# Patient Record
Sex: Male | Born: 1965
Health system: Southern US, Community
[De-identification: ages and names within clinical notes are randomized; demographics above are authoritative.]

## PROBLEM LIST (undated history)

## (undated) DIAGNOSIS — E039 Hypothyroidism, unspecified: Secondary | ICD-10-CM

## (undated) DIAGNOSIS — E785 Hyperlipidemia, unspecified: Secondary | ICD-10-CM

## (undated) DIAGNOSIS — M1990 Unspecified osteoarthritis, unspecified site: Secondary | ICD-10-CM

## (undated) DIAGNOSIS — K219 Gastro-esophageal reflux disease without esophagitis: Secondary | ICD-10-CM

## (undated) DIAGNOSIS — E0500 Thyrotoxicosis with diffuse goiter without thyrotoxic crisis or storm: Secondary | ICD-10-CM

## (undated) DIAGNOSIS — J302 Other seasonal allergic rhinitis: Secondary | ICD-10-CM

## (undated) DIAGNOSIS — I10 Essential (primary) hypertension: Secondary | ICD-10-CM

## (undated) DIAGNOSIS — J189 Pneumonia, unspecified organism: Secondary | ICD-10-CM

## (undated) HISTORY — PX: THYROIDECTOMY: SHX17

## (undated) HISTORY — DX: Other seasonal allergic rhinitis: J30.2

## (undated) HISTORY — DX: Hyperlipidemia, unspecified: E78.5

## (undated) HISTORY — DX: Pneumonia, unspecified organism: J18.9

## (undated) HISTORY — PX: OTHER SURGICAL HISTORY: SHX169

## (undated) HISTORY — DX: Gastro-esophageal reflux disease without esophagitis: K21.9

## (undated) HISTORY — DX: Essential (primary) hypertension: I10

## (undated) HISTORY — DX: Hypothyroidism, unspecified: E03.9

## (undated) HISTORY — PX: WISDOM TOOTH EXTRACTION: SHX21

---

## 2004-08-31 ENCOUNTER — Emergency Department (HOSPITAL_COMMUNITY): Admission: EM | Admit: 2004-08-31 | Discharge: 2004-08-31 | Payer: Self-pay | Admitting: Emergency Medicine

## 2005-04-26 ENCOUNTER — Emergency Department (HOSPITAL_COMMUNITY): Admission: EM | Admit: 2005-04-26 | Discharge: 2005-04-26 | Payer: Self-pay | Admitting: Emergency Medicine

## 2006-07-19 ENCOUNTER — Ambulatory Visit: Payer: Self-pay | Admitting: Family Medicine

## 2006-07-19 LAB — CONVERTED CEMR LAB
ALT: 20 units/L (ref 0–40)
AST: 19 units/L (ref 0–37)
BUN: 15 mg/dL (ref 6–23)
Basophils Relative: 0.5 % (ref 0.0–1.0)
Calcium: 9 mg/dL (ref 8.4–10.5)
Chloride: 106 meq/L (ref 96–112)
Cholesterol: 161 mg/dL (ref 0–200)
Eosinophil percent: 1.9 % (ref 0.0–5.0)
GFR calc non Af Amer: 78 mL/min
HCT: 42.1 % (ref 39.0–52.0)
HDL: 41.4 mg/dL (ref >39.0)
Hemoglobin: 13.6 g/dL (ref 13.0–17.0)
MCV: 87.2 fL (ref 78.0–100.0)
Monocytes Absolute: 0.8 10*3/uL — ABNORMAL HIGH (ref 0.2–0.7)
PSA: 0.96 ng/mL (ref 0.10–4.00)
RBC: 4.83 M/uL (ref 4.22–5.81)
RDW: 14 % (ref 11.5–14.6)
Sodium: 138 meq/L (ref 135–145)
VLDL: 9 mg/dL (ref 0–40)
WBC: 7.5 10*3/uL (ref 4.5–10.5)

## 2006-07-26 ENCOUNTER — Ambulatory Visit: Payer: Self-pay | Admitting: Family Medicine

## 2007-05-20 DIAGNOSIS — K219 Gastro-esophageal reflux disease without esophagitis: Secondary | ICD-10-CM | POA: Insufficient documentation

## 2007-09-03 ENCOUNTER — Telehealth: Payer: Self-pay | Admitting: Family Medicine

## 2007-11-07 ENCOUNTER — Ambulatory Visit: Payer: Self-pay | Admitting: Family Medicine

## 2007-11-07 LAB — CONVERTED CEMR LAB
Bilirubin Urine: NEGATIVE
Ketones, urine, test strip: NEGATIVE
pH: 5.5

## 2007-11-12 LAB — CONVERTED CEMR LAB
Albumin: 3.9 g/dL (ref 3.5–5.2)
Alkaline Phosphatase: 69 units/L (ref 39–117)
BUN: 15 mg/dL (ref 6–23)
Basophils Absolute: 0 10*3/uL (ref 0.0–0.1)
CO2: 29 meq/L (ref 19–32)
Cholesterol: 175 mg/dL (ref 0–200)
GFR calc Af Amer: 94 mL/min
HDL: 42.7 mg/dL (ref >39.0)
Hemoglobin: 14.3 g/dL (ref 13.0–17.0)
Lymphocytes Relative: 32.4 % (ref 12.0–46.0)
MCHC: 32.6 g/dL (ref 30.0–36.0)
Monocytes Absolute: 0.6 10*3/uL (ref 0.2–0.7)
Monocytes Relative: 13.2 % — ABNORMAL HIGH (ref 3.0–11.0)
Neutro Abs: 2.4 10*3/uL (ref 1.4–7.7)
PSA: 0.76 ng/mL (ref 0.10–4.00)
Potassium: 4.8 meq/L (ref 3.5–5.1)
TSH: 0.66 microintl units/mL (ref 0.35–5.50)
Total Protein: 6.9 g/dL (ref 6.0–8.3)

## 2007-11-13 ENCOUNTER — Ambulatory Visit: Payer: Self-pay | Admitting: Family Medicine

## 2007-11-13 DIAGNOSIS — E785 Hyperlipidemia, unspecified: Secondary | ICD-10-CM

## 2010-04-22 ENCOUNTER — Ambulatory Visit: Payer: Self-pay | Admitting: Family Medicine

## 2010-04-28 LAB — CONVERTED CEMR LAB
Albumin: 3.8 g/dL (ref 3.5–5.2)
BUN: 18 mg/dL (ref 6–23)
Basophils Absolute: 0 10*3/uL (ref 0.0–0.1)
CO2: 26 meq/L (ref 19–32)
Cholesterol: 184 mg/dL (ref 0–200)
Eosinophils Absolute: 0.1 10*3/uL (ref 0.0–0.7)
GFR calc non Af Amer: 92.44 mL/min (ref >60)
Glucose, Bld: 77 mg/dL (ref 70–99)
HCT: 41.9 % (ref 39.0–52.0)
Hemoglobin, Urine: NEGATIVE
Leukocytes, UA: NEGATIVE
Lymphs Abs: 1.4 10*3/uL (ref 0.7–4.0)
MCHC: 34 g/dL (ref 30.0–36.0)
MCV: 87.2 fL (ref 78.0–100.0)
Monocytes Absolute: 0.5 10*3/uL (ref 0.1–1.0)
Neutro Abs: 2.8 10*3/uL (ref 1.4–7.7)
PSA: 0.94 ng/mL (ref 0.10–4.00)
Platelets: 189 10*3/uL (ref 150.0–400.0)
Potassium: 4.8 meq/L (ref 3.5–5.1)
RDW: 14.9 % — ABNORMAL HIGH (ref 11.5–14.6)
Specific Gravity, Urine: >=1.03 (ref 1.000–1.030)
TSH: 0.53 microintl units/mL (ref 0.35–5.50)
Total Bilirubin: 0.7 mg/dL (ref 0.3–1.2)
Urobilinogen, UA: 0.2 (ref 0.0–1.0)
VLDL: 12 mg/dL (ref 0.0–40.0)

## 2010-05-02 ENCOUNTER — Ambulatory Visit: Payer: Self-pay | Admitting: Family Medicine

## 2010-09-20 NOTE — Assessment & Plan Note (Signed)
Summary: CPX/CJR   Vital Signs:  Patient profile:   45 year old male Height:      69 inches Weight:      231 pounds BMI:     34.24 O2 Sat:      96 % Temp:     98.6 degrees F Pulse rate:   79 / minute BP sitting:   140 / 92  (left arm)  Vitals Entered By: Pura Spice, RN (May 02, 2010 1:49 PM) CC: cpx no energy c/o weight gain    History of Present Illness: 45 yr old male for a cpx. His main concern today is some mild urinary urgency and nocturia once or twice a night. No discomfort. He knows he is overweight, and he has started walking with his dog every day in the past 2 weeks. His wife is diabetic, and he has started to follow her lead as far as diet goes.   Preventive Screening-Counseling & Management  Alcohol-Tobacco     Packs/Day: 0.25  Allergies: 1)  ! * Nizoral  Past History:  Past Medical History: Reviewed history from 11/13/2007 and no changes required. GERD Allergies Hyperlipidemia  Past Surgical History: Reviewed history from 05/20/2007 and no changes required. Denies surgical history  Family History: Reviewed history from 05/20/2007 and no changes required. Family History of Alcoholism/Addiction Family History of Arthritis Family History of Colon CA 1st degree relative <60 Family History Diabetes 1st degree relative Family History Kidney disease Family History of Stroke M 1st degree relative <50 Family History of Cardiovascular disorder  Social History: Reviewed history from 05/20/2007 and no changes required. Occupation: Married Current Smoker Alcohol use-no Drug use-no Regular exercise-yes Packs/Day:  0.25  Review of Systems  The patient denies anorexia, fever, weight loss, vision loss, decreased hearing, hoarseness, chest pain, syncope, dyspnea on exertion, peripheral edema, prolonged cough, headaches, hemoptysis, abdominal pain, melena, hematochezia, severe indigestion/heartburn, hematuria, incontinence, genital sores, muscle  weakness, suspicious skin lesions, transient blindness, difficulty walking, depression, unusual weight change, abnormal bleeding, enlarged lymph nodes, angioedema, breast masses, and testicular masses.    Physical Exam  General:  overweight-appearing.   Head:  Normocephalic and atraumatic without obvious abnormalities. No apparent alopecia or balding. Eyes:  No corneal or conjunctival inflammation noted. EOMI. Perrla. Funduscopic exam benign, without hemorrhages, exudates or papilledema. Vision grossly normal. Ears:  External ear exam shows no significant lesions or deformities.  Otoscopic examination reveals clear canals, tympanic membranes are intact bilaterally without bulging, retraction, inflammation or discharge. Hearing is grossly normal bilaterally. Nose:  External nasal examination shows no deformity or inflammation. Nasal mucosa are pink and moist without lesions or exudates. Mouth:  Oral mucosa and oropharynx without lesions or exudates.  Teeth in good repair. Neck:  No deformities, masses, or tenderness noted. Chest Wall:  No deformities, masses, tenderness or gynecomastia noted. Lungs:  Normal respiratory effort, chest expands symmetrically. Lungs are clear to auscultation, no crackles or wheezes. Heart:  Normal rate and regular rhythm. S1 and S2 normal without gallop, murmur, click, rub or other extra sounds. EKG normal Abdomen:  Bowel sounds positive,abdomen soft and non-tender without masses, organomegaly or hernias noted. Rectal:  No external abnormalities noted. Normal sphincter tone. No rectal masses or tenderness. Genitalia:  Testes bilaterally descended without nodularity, tenderness or masses. No scrotal masses or lesions. No penis lesions or urethral discharge. Prostate:  no nodules, no asymmetry, no induration, and 1+ enlarged.   Msk:  No deformity or scoliosis noted of thoracic or lumbar spine.  Pulses:  R and L carotid,radial,femoral,dorsalis pedis and posterior tibial  pulses are full and equal bilaterally Extremities:  No clubbing, cyanosis, edema, or deformity noted with normal full range of motion of all joints.   Neurologic:  No cranial nerve deficits noted. Station and gait are normal. Plantar reflexes are down-going bilaterally. DTRs are symmetrical throughout. Sensory, motor and coordinative functions appear intact. Skin:  Intact without suspicious lesions or rashes Cervical Nodes:  No lymphadenopathy noted Axillary Nodes:  No palpable lymphadenopathy Inguinal Nodes:  No significant adenopathy Psych:  Cognition and judgment appear intact. Alert and cooperative with normal attention span and concentration. No apparent delusions, illusions, hallucinations   Impression & Recommendations:  Problem # 1:  PHYSICAL EXAMINATION (ICD-V70.0)  Orders: EKG w/ Interpretation (93000)  Patient Instructions: 1)  It is important that you exercise reguarly at least 20 minutes 5 times a week. If you develop chest pain, have severe difficulty breathing, or feel very tired, stop exercising immediately and seek medical attention.  2)  You need to lose weight. Consider a lower calorie diet and regular exercise.

## 2011-01-06 NOTE — Assessment & Plan Note (Signed)
[facility] OFFICE NOTE   NAME:Howard, Christopher                       MRN:          [id number]  DATE:07/26/2006                            DOB:          15-May-1966    This is a 45 year old gentleman here with his wife to establish with a  practice.  He is also here for a complete physical examination.  He has  not had a physical in several years.  One of his concerns is being  overweight.  He would also like to stop smoking but realizes that most  people gain some weight when they stop smoking.  He would like to have  an appetite suppressant temporarily to help him while he stops smoking  so that he can lose weight at the same time.  Otherwise, he has some  problems with occasional heartburn, also with a sensation of choking  which often interrupts his sleep during the night.  There is no  difficulty swallowing.  No nausea or vomiting.  No shortness of breath,  wheezing, or coughing.   PAST MEDICAL HISTORY:  He has not seen a primary care physician in some  time.  He has a history of allergies and acid reflux but nothing  significant.  He has never had a surgery.   ALLERGIES:  NIZORAL WHICH CAUSED TROUBLE BREATHING, which he took orally  at one point to clear up a rash on his back.   CURRENT MEDICATIONS:  None.   HABITS:  He smokes about 3-5 cigarettes a day.  He does not use alcohol.   SOCIAL HISTORY:  He is married with 2 children.  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   FAMILY HISTORY:  Remarkable for alcoholism in his parents, also heart  disease, stroke, and diabetes in his distant relatives.   IMMUNIZATIONS:  He has had a flu shot this year as usual.   OBJECTIVE:  Height 5 feet 10 inches.  Weight 235.  BP 138/84.  Pulse 72  and regular.  GENERAL:  He is a little overweight.  SKIN:  Clear except for a faint hypopigmented rash on the back.  EYES:  Sclerae clear.  PHARYNX:   Clear.  NECK:  Supple without lymphadenopathy or masses.  LUNGS:  Clear.  CARDIAC:  Rate and rhythm regular without gallops, murmurs, or rubs.  Distal pulses are full.  ABDOMEN:  Soft, normal bowel sounds, nontender.  No masses.  GENITALIA:  Normal male.  He is circumcised.  RECTAL:  No masses or tenderness.  Stool Hemoccult negative.  Prostate  is within normal limits.  EXTREMITIES:  No clubbing, cyanosis, or edema.  NEUROLOGIC:  Grossly intact.   He was here for fasting labs on November 29.  This was remarkable only  for his lipid panel.  Everything else was fine including a PSA.  His HDL  was excellent at 41.  LDL was very slightly high at 110.   ASSESSMENT AND PLAN:  1. Complete physical.  I encourage him to get more regular exercise.  2. Assistance to lose  weight.  I wrote for a 67-month supply of Meridia      15 mg to take once a day.  During this time, he will attempt to      quit smoking as well.  3. Hyperlipidemia.  I asked him to watch his diet.  I am sending his      wife to a nutritionist because we just diagnosed her with diabetes      today.  I encouraged him to accompany her to that visit to learn      more about good nutrition.  4. Allergies.  Allegra 180 mg once a day.  5. Gastroesophageal reflux disease.  Omeprazole 20 mg nightly.  6. Tinea versicolor, at this point is asymptomatic and he does not      wish to have it treated.     Tera Mater. Clent Ridges, MD  Electronically Signed    SAF/MedQ  DD: 07/27/2006  DT: 07/27/2006  Job #: [id number]

## 2011-03-10 ENCOUNTER — Inpatient Hospital Stay (INDEPENDENT_AMBULATORY_CARE_PROVIDER_SITE_OTHER)
Admission: RE | Admit: 2011-03-10 | Discharge: 2011-03-10 | Disposition: A | Discharge status: Home / Self Care | Payer: 59 | Source: Ambulatory Visit | Attending: Family Medicine | Admitting: Family Medicine

## 2011-03-10 DIAGNOSIS — J309 Allergic rhinitis, unspecified: Secondary | ICD-10-CM

## 2011-04-17 ENCOUNTER — Telehealth: Payer: Self-pay | Admitting: Family Medicine

## 2011-04-17 DIAGNOSIS — Z0000 Encounter for general adult medical examination without abnormal findings: Secondary | ICD-10-CM

## 2011-04-17 NOTE — Telephone Encounter (Signed)
Pt called and has sch cpx labs on 05/03/11 at [facility]. Need to get lab order.

## 2011-04-20 NOTE — Telephone Encounter (Signed)
Future orders placed 

## 2011-05-03 ENCOUNTER — Other Ambulatory Visit (INDEPENDENT_AMBULATORY_CARE_PROVIDER_SITE_OTHER): Payer: 59

## 2011-05-03 DIAGNOSIS — Z0000 Encounter for general adult medical examination without abnormal findings: Secondary | ICD-10-CM

## 2011-05-03 LAB — LIPID PANEL
HDL: 33.7 mg/dL — ABNORMAL LOW (ref >39.00)
Triglycerides: 62 mg/dL (ref 0.0–149.0)
VLDL: 12.4 mg/dL (ref 0.0–40.0)

## 2011-05-03 LAB — CBC WITH DIFFERENTIAL/PLATELET
Basophils Absolute: 0 10*3/uL (ref 0.0–0.1)
Eosinophils Absolute: 0.2 10*3/uL (ref 0.0–0.7)
Hemoglobin: 13.4 g/dL (ref 13.0–17.0)
Lymphocytes Relative: 25.5 % (ref 12.0–46.0)
MCHC: 32.7 g/dL (ref 30.0–36.0)
Monocytes Relative: 16 % — ABNORMAL HIGH (ref 3.0–12.0)
Neutro Abs: 2.5 10*3/uL (ref 1.4–7.7)
Neutrophils Relative %: 54.1 % (ref 43.0–77.0)
RBC: 4.86 Mil/uL (ref 4.22–5.81)
RDW: 13.8 % (ref 11.5–14.6)

## 2011-05-03 LAB — HEPATIC FUNCTION PANEL
ALT: 27 U/L (ref 0–53)
AST: 19 U/L (ref 0–37)
Albumin: 3.7 g/dL (ref 3.5–5.2)
Total Protein: 6.7 g/dL (ref 6.0–8.3)

## 2011-05-03 LAB — BASIC METABOLIC PANEL
BUN: 17 mg/dL (ref 6–23)
Calcium: 9.5 mg/dL (ref 8.4–10.5)
GFR: 116.01 mL/min (ref >60.00)
Glucose, Bld: 87 mg/dL (ref 70–99)
Potassium: 4.6 mEq/L (ref 3.5–5.1)
Sodium: 139 mEq/L (ref 135–145)

## 2011-05-03 LAB — TSH: TSH: 0.01 u[IU]/mL — ABNORMAL LOW (ref 0.35–5.50)

## 2011-05-04 ENCOUNTER — Telehealth: Payer: Self-pay | Admitting: Family Medicine

## 2011-05-04 NOTE — Telephone Encounter (Signed)
Message copied by Baldemar Friday on Thu May 04, 2011  3:55 PM ------      Message from: Gershon Crane A      Created: Thu May 04, 2011 12:37 PM       Normal except a possible thyroid problem. Call the lab to add a free T3 and a free T4 to his lab requests for V70.0

## 2011-05-04 NOTE — Telephone Encounter (Signed)
Spoke with pt and called lab to do a add on.

## 2011-05-05 LAB — T4, FREE: Free T4: 2.91 ng/dL — ABNORMAL HIGH (ref 0.60–1.60)

## 2011-05-05 LAB — T3, FREE: T3, Free: 9.4 pg/mL — ABNORMAL HIGH (ref 2.3–4.2)

## 2011-05-10 ENCOUNTER — Encounter: Payer: Self-pay | Admitting: Family Medicine

## 2011-05-10 ENCOUNTER — Ambulatory Visit (INDEPENDENT_AMBULATORY_CARE_PROVIDER_SITE_OTHER): Payer: 59 | Admitting: Family Medicine

## 2011-05-10 VITALS — BP 150/82 | HR 102 | Temp 98.8°F | Ht 68.25 in | Wt 221.0 lb

## 2011-05-10 DIAGNOSIS — E0590 Thyrotoxicosis, unspecified without thyrotoxic crisis or storm: Secondary | ICD-10-CM

## 2011-05-10 DIAGNOSIS — Z0000 Encounter for general adult medical examination without abnormal findings: Secondary | ICD-10-CM

## 2011-05-10 NOTE — Progress Notes (Signed)
  Subjective:    Patient ID: Christopher Howard, male    DOB: 1966/07/17, 45 y.o.   MRN: [id number]  HPI 45 yr old male for a cpx. He had been feeling fine until about one month ago, when he noticed he was getting tired easily. The fatigue has  steadily gotten worse since then. He also has lost about 15 lbs of weight, he feels SOB, he has trouble sleeping, and he sweats a lot. No chest pain or HA or fevers. His recent labs show a suppressed TSH with elevated free T3 and free T4.    Review of Systems  Constitutional: Positive for fatigue and unexpected weight change. Negative for fever, chills and diaphoresis.  HENT: Negative.   Eyes: Negative.   Respiratory: Positive for shortness of breath. Negative for apnea, cough, choking, chest tightness, wheezing and stridor.   Cardiovascular: Negative.   Gastrointestinal: Negative.   Genitourinary: Negative.   Musculoskeletal: Negative.   Skin: Negative.   Neurological: Negative.   Hematological: Negative.   Psychiatric/Behavioral: Negative.        Objective:   Physical Exam  Constitutional: He is oriented to person, place, and time. He appears well-developed and well-nourished. No distress.  HENT:  Head: Normocephalic and atraumatic.  Right Ear: External ear normal.  Left Ear: External ear normal.  Nose: Nose normal.  Mouth/Throat: Oropharynx is clear and moist. No oropharyngeal exudate.  Eyes: Conjunctivae and EOM are normal. Pupils are equal, round, and reactive to light. Right eye exhibits no discharge. Left eye exhibits no discharge. No scleral icterus.  Neck: Neck supple. No JVD present. No tracheal deviation present. No thyromegaly present.  Cardiovascular: Normal rate, regular rhythm, normal heart sounds and intact distal pulses.  Exam reveals no gallop and no friction rub.   No murmur heard. Pulmonary/Chest: Effort normal and breath sounds normal. No respiratory distress. He has no wheezes. He has no rales. He exhibits no tenderness.    Abdominal: Soft. Bowel sounds are normal. He exhibits no distension and no mass. There is no tenderness. There is no rebound and no guarding.  Genitourinary: Rectum normal, prostate normal and penis normal. No penile tenderness.  Musculoskeletal: Normal range of motion. He exhibits no edema and no tenderness.  Lymphadenopathy:    He has no cervical adenopathy.  Neurological: He is alert and oriented to person, place, and time. He has normal reflexes. No cranial nerve deficit. He exhibits normal muscle tone. Coordination normal.  Skin: Skin is warm and dry. No rash noted. He is not diaphoretic. No erythema. No pallor.  Psychiatric: He has a normal mood and affect. His behavior is normal. Judgment and thought content normal.          Assessment & Plan:  Newly diagnosed hyperthyroidism. We will refer him to Endocrinology. Get a PSA today.

## 2011-05-11 ENCOUNTER — Telehealth: Payer: Self-pay | Admitting: Family Medicine

## 2011-05-11 NOTE — Telephone Encounter (Signed)
Message copied by Baldemar Friday on Thu May 11, 2011  2:51 PM ------      Message from: Gershon Crane A      Created: Mon May 08, 2011  5:30 AM       He definitely has an overactive thyroid gland. We will refer her to Endocrinology. I will discuss this with him at his cpx on Wednesday.

## 2011-05-11 NOTE — Telephone Encounter (Signed)
Pt came in for office visit on 05/10/11 and Dr. Clent Ridges went over these results.

## 2011-05-22 ENCOUNTER — Encounter: Payer: Self-pay | Admitting: Endocrinology

## 2011-05-22 ENCOUNTER — Ambulatory Visit (INDEPENDENT_AMBULATORY_CARE_PROVIDER_SITE_OTHER): Payer: 59 | Admitting: Endocrinology

## 2011-05-22 DIAGNOSIS — E0590 Thyrotoxicosis, unspecified without thyrotoxic crisis or storm: Secondary | ICD-10-CM

## 2011-05-22 MED ORDER — ALPRAZOLAM 0.25 MG PO TABS: 0.2500 mg | ORAL_TABLET | Freq: Three times a day (TID) | ORAL | Status: AC | PRN

## 2011-05-22 NOTE — Progress Notes (Signed)
Subjective:    Patient ID: Christopher Howard, male    DOB: [birth year], 45 y.o.   MRN: [id number]  HPI Pt states a few mos of moderate palpitations in the chest, in the context of exertion.  he is unaware of ever having had any thyroid problem before.  He says his bp was elev in the past, but not today.   Past Medical History  Diagnosis Date  . GERD (gastroesophageal reflux disease)   . Seasonal allergies   . Hyperlipidemia     No past surgical history on file.  History   Social History  . Marital Status: Married    Spouse Name: N/A    Number of Children: N/A  . Years of Education: N/A   Occupational History  . Not on file.   Social History Main Topics  . Smoking status: Current Some Day Smoker -- 20 years    Types: Cigarettes  . Smokeless tobacco: Never Used  . Alcohol Use: No  . Drug Use: No  . Sexually Active: Not on file   Other Topics Concern  . Not on file   Social History Narrative   Regular exercise-yes    No current outpatient prescriptions on file prior to visit.    Allergies  Allergen Reactions  . Ketoconazole     Family History  Problem Relation Age of Onset  . Alcohol abuse Other   . Arthritis Other   . Cancer Other     colon  . Diabetes Other   . Kidney disease Other   . Stroke Other   . Heart disease Other   no goiter or other thyroid probs  BP 122/68  Pulse 94  Temp(Src) 98.3 F (36.8 C) (Oral)  Ht 5\' 9"  (1.753 m)  Wt 219 lb (99.338 kg)  BMI 32.34 kg/m2  SpO2 97%    Review of Systems denies hoarseness, double vision, diarrhea, myalgias, numbness, hypoglycemia, and easy bruising.  He reports fatigue, doe, headache, urinary frequency, tremor, anxiety, and excessive diaphoresis.  He has lost 10 lbs x 1 month.  He has a sensation of fullness at the right anterior neck.  He has slight rhinorrhea.     Objective:   Physical Exam VS: see vs page GEN: no distress HEAD: head: no deformity eyes: no periorbital swelling, but there is  slight bilat proptosis external nose and ears are normal mouth: no lesion seen NECK: supple, thyroid is at least 5x normal size, right >> left.   CHEST WALL: no deformity LUNGS: clear to auscultation CV: reg rate and rhythm, no murmur ABD: abdomen is soft, nontender.  no hepatosplenomegaly.  not distended.  no hernia MUSCULOSKELETAL: muscle bulk and strength are grossly normal.  no obvious joint swelling.  gait is normal and steady EXTEMITIES: no deformity.  no ulcer on the feet.  feet are of normal color and temp.  no edema PULSES: dorsalis pedis intact bilat.  no carotid bruit NEURO:  cn 2-12 grossly intact.   readily moves all 4's.  sensation is intact to touch on the feet.  There is a slight tremor of the hands.   SKIN:  Normal texture and temperature.  No rash or suspicious lesion is visible.   NODES:  None palpable at the neck. PSYCH: alert, oriented x3.  Does not appear anxious nor depressed.   Lab Results  Component Value Date   TSH 0.01* 05/03/2011  tsh was normal in 2011 and 2009. free t4=2.91    Assessment & Plan:  Grave's  dz, new Hyperthyroidism due to grave's dz, new Htn, situational.  However, i hesitate to rx b-blocker now, due to today's bp.

## 2011-05-22 NOTE — Patient Instructions (Addendum)
If you chose the radioactive iodine treatment option, we would start by checking a thyroid "scan" (a special, but easy and painless type of thyroid x ray).  It works like this: you go to the x-ray department of the hospital to swallow a pill, which contains a miniscule amount of radiation.  You will not notice any symptoms from this.  You will go back to the x-ray department the next day, to lie down in front of a camera.  The results of this will be sent to me.   Based on the results, i hope to order for you a treatment pill of radioactive iodine.  Although it is a larger amount of radiation, you will again notice no symptoms from this.  The pill is gone from your body in a few days (during which you should stay away from other people), but takes several months to work.  Therefore, please return here approximately 6-8 weeks after the treatment.  This treatment has been available for many years, and the only known side-effect is an underactive thyroid.  It is possible that i would eventually prescribe for you a thyroid hormone pill, which is very inexpensive.  You don't have to worry about side-effects of this thyroid hormone pill, because it is the same molecule your thyroid makes. Another option would be to tale "methimazole" ( a 1-2x/day pill to slow down the thyroid).  if ever you have fever while taking methimazole, stop it and call us, because of the risk of a rare side-effect Please consider your options, and let me know. Here is a prescription for a pill for symptoms, in the meantime.

## 2011-05-29 ENCOUNTER — Telehealth: Payer: Self-pay | Admitting: *Deleted

## 2011-05-29 DIAGNOSIS — E0590 Thyrotoxicosis, unspecified without thyrotoxic crisis or storm: Secondary | ICD-10-CM

## 2011-05-29 NOTE — Telephone Encounter (Signed)
Pt left message stating that he is ready to proceed with radioactive iodine treatment. (Okay to leave message on cell # per pt)

## 2011-05-29 NOTE — Telephone Encounter (Signed)
Pt informed scan ordered via VM.

## 2011-05-29 NOTE — Telephone Encounter (Signed)
i ordered scan 

## 2011-06-12 ENCOUNTER — Encounter (HOSPITAL_COMMUNITY)
Admission: RE | Admit: 2011-06-12 | Discharge: 2011-06-12 | Disposition: A | Discharge status: Home / Self Care | Payer: 59 | Source: Ambulatory Visit | Attending: Endocrinology | Admitting: Endocrinology

## 2011-06-12 DIAGNOSIS — E0590 Thyrotoxicosis, unspecified without thyrotoxic crisis or storm: Secondary | ICD-10-CM | POA: Insufficient documentation

## 2011-06-13 ENCOUNTER — Encounter (HOSPITAL_COMMUNITY)
Admission: RE | Admit: 2011-06-13 | Discharge: 2011-06-13 | Disposition: A | Discharge status: Home / Self Care | Payer: 59 | Source: Ambulatory Visit | Attending: Endocrinology | Admitting: Endocrinology

## 2011-06-13 ENCOUNTER — Other Ambulatory Visit: Payer: Self-pay | Admitting: Endocrinology

## 2011-06-13 DIAGNOSIS — E0590 Thyrotoxicosis, unspecified without thyrotoxic crisis or storm: Secondary | ICD-10-CM

## 2011-06-13 MED ORDER — SODIUM IODIDE I 131 CAPSULE
9.0000 | Freq: Once | INTRAVENOUS | Status: AC | PRN
  Administered 2011-06-13: 9 via ORAL

## 2011-06-13 MED ORDER — SODIUM PERTECHNETATE TC 99M INJECTION
10.0000 | Freq: Once | INTRAVENOUS | Status: AC | PRN
  Administered 2011-06-13: 10 via INTRAVENOUS

## 2011-06-23 ENCOUNTER — Encounter (HOSPITAL_COMMUNITY): Payer: Self-pay

## 2011-06-23 ENCOUNTER — Encounter (HOSPITAL_COMMUNITY)
Admission: RE | Admit: 2011-06-23 | Discharge: 2011-06-23 | Disposition: A | Discharge status: Home / Self Care | Payer: 59 | Source: Ambulatory Visit | Attending: Endocrinology | Admitting: Endocrinology

## 2011-06-23 DIAGNOSIS — E0590 Thyrotoxicosis, unspecified without thyrotoxic crisis or storm: Secondary | ICD-10-CM

## 2011-06-23 HISTORY — DX: Thyrotoxicosis with diffuse goiter without thyrotoxic crisis or storm: E05.00

## 2011-06-23 MED ORDER — SODIUM IODIDE I 131 CAPSULE
17.9000 | Freq: Once | INTRAVENOUS | Status: AC | PRN
  Administered 2011-06-23: 17.9 via ORAL

## 2011-07-31 ENCOUNTER — Ambulatory Visit (INDEPENDENT_AMBULATORY_CARE_PROVIDER_SITE_OTHER): Payer: 59 | Admitting: Endocrinology

## 2011-07-31 ENCOUNTER — Encounter: Payer: Self-pay | Admitting: Endocrinology

## 2011-07-31 ENCOUNTER — Other Ambulatory Visit (INDEPENDENT_AMBULATORY_CARE_PROVIDER_SITE_OTHER): Payer: 59

## 2011-07-31 VITALS — BP 126/82 | HR 60 | Temp 97.9°F | Ht 70.0 in | Wt 214.5 lb

## 2011-07-31 DIAGNOSIS — E0590 Thyrotoxicosis, unspecified without thyrotoxic crisis or storm: Secondary | ICD-10-CM

## 2011-07-31 NOTE — Progress Notes (Signed)
  Subjective:    Patient ID: Christopher Howard, male    DOB: May 12, 1966, 45 y.o.   MRN: [id number]  HPI Pt is 6 weeks s/p i-131 rx for hyperthyroidism, due to grave's dz.  pt states he feels well in general.   Past Medical History  Diagnosis Date  . GERD (gastroesophageal reflux disease)   . Seasonal allergies   . Hyperlipidemia   . Diffuse toxic goiter     No past surgical history on file.  History   Social History  . Marital Status: Married    Spouse Name: N/A    Number of Children: N/A  . Years of Education: N/A   Occupational History  . Not on file.   Social History Main Topics  . Smoking status: Current Some Day Smoker -- 20 years    Types: Cigarettes  . Smokeless tobacco: Never Used  . Alcohol Use: No  . Drug Use: No  . Sexually Active: Not on file   Other Topics Concern  . Not on file   Social History Narrative   Regular exercise-yes    No current outpatient prescriptions on file prior to visit.    Allergies  Allergen Reactions  . Ketoconazole     Family History  Problem Relation Age of Onset  . Alcohol abuse Other   . Arthritis Other   . Cancer Other     colon  . Diabetes Other   . Kidney disease Other   . Stroke Other   . Heart disease Other     BP 126/82  Pulse 60  Temp(Src) 97.9 F (36.6 C) (Oral)  Ht 5\' 10"  (1.778 m)  Wt 214 lb 8 oz (97.297 kg)  BMI 30.78 kg/m2  SpO2 97%  Review of Systems He has regained a few lbs    Objective:   Physical Exam VITAL SIGNS:  See vs page GENERAL: no distress NECK: The right lobe of the thyroid is slightly enlarged, and the left is normal.  No thyroid nodule is palpable.  No palpable lymphadenopathy at the anterior neck.   Lab Results  Component Value Date   TSH 0.03* 07/31/2011  (i also reviewed free T4)    Assessment & Plan:  Hyperthyroidism, not better yet, but it could improve soon)

## 2011-07-31 NOTE — Patient Instructions (Addendum)
blood tests are being requested for you today.  please call [phone] to hear your test results.  You will be prompted to enter the 9-digit "MRN" number that appears at the top left of this page, followed by #.  Then you will hear the message. Please come back for a follow-up appointment for 1 month.  Please make an appointment. (update: i left message on phone-tree:  Go back to lab in 2 weeks to recheck tft)

## 2011-08-18 ENCOUNTER — Other Ambulatory Visit (INDEPENDENT_AMBULATORY_CARE_PROVIDER_SITE_OTHER): Payer: 59

## 2011-08-18 DIAGNOSIS — E0590 Thyrotoxicosis, unspecified without thyrotoxic crisis or storm: Secondary | ICD-10-CM

## 2011-08-28 ENCOUNTER — Telehealth: Payer: Self-pay | Admitting: Endocrinology

## 2011-08-28 MED ORDER — LEVOTHYROXINE SODIUM 100 MCG PO TABS: 100.0000 ug | ORAL_TABLET | Freq: Every day | ORAL | Status: DC

## 2011-08-28 NOTE — Telephone Encounter (Signed)
PT HAD A CALL THAT THYROID MEDICINE WOULD BE CALLED IN FOR HIM TO START.  HIS DRUG STORE DID NOT HAVE IT.  HE USES [facility].

## 2011-08-28 NOTE — Telephone Encounter (Signed)
i sent rx 

## 2011-08-28 NOTE — Telephone Encounter (Signed)
Pt informed of new rx.  

## 2011-09-01 ENCOUNTER — Other Ambulatory Visit (INDEPENDENT_AMBULATORY_CARE_PROVIDER_SITE_OTHER): Payer: 59

## 2011-09-01 ENCOUNTER — Encounter: Payer: Self-pay | Admitting: Endocrinology

## 2011-09-01 ENCOUNTER — Ambulatory Visit (INDEPENDENT_AMBULATORY_CARE_PROVIDER_SITE_OTHER): Payer: 59 | Admitting: Endocrinology

## 2011-09-01 VITALS — BP 128/92 | HR 58 | Temp 98.3°F | Ht 71.0 in | Wt 212.6 lb

## 2011-09-01 DIAGNOSIS — E0590 Thyrotoxicosis, unspecified without thyrotoxic crisis or storm: Secondary | ICD-10-CM

## 2011-09-01 LAB — TSH: TSH: 10.49 u[IU]/mL — ABNORMAL HIGH (ref 0.35–5.50)

## 2011-09-01 LAB — T4, FREE: Free T4: 0.5 ng/dL — ABNORMAL LOW (ref 0.60–1.60)

## 2011-09-01 NOTE — Progress Notes (Signed)
  Subjective:    Patient ID: Christopher Howard, male    DOB: 1966/06/26, 46 y.o.   MRN: [id number]  HPI Pt is almost 3 mos s/p i-131 rx, for hyperthyroidism due to grave's dz.  He started synthroid 3 days ago.  pt states he feels no different, and well in general.  He attributes weight loss to oral surgery.   Past Medical History  Diagnosis Date  . GERD (gastroesophageal reflux disease)   . Seasonal allergies   . Hyperlipidemia   . Diffuse toxic goiter     No past surgical history on file.  History   Social History  . Marital Status: Married    Spouse Name: N/A    Number of Children: N/A  . Years of Education: N/A   Occupational History  . Not on file.   Social History Main Topics  . Smoking status: Current Some Day Smoker -- 20 years    Types: Cigarettes  . Smokeless tobacco: Never Used  . Alcohol Use: No  . Drug Use: No  . Sexually Active: Not on file   Other Topics Concern  . Not on file   Social History Narrative   Regular exercise-yes    Current Outpatient Prescriptions on File Prior to Visit  Medication Sig Dispense Refill  . levothyroxine (SYNTHROID, LEVOTHROID) 100 MCG tablet Take 1 tablet (100 mcg total) by mouth daily.  30 tablet  2    Allergies  Allergen Reactions  . Ketoconazole     Family History  Problem Relation Age of Onset  . Alcohol abuse Other   . Arthritis Other   . Cancer Other     colon  . Diabetes Other   . Kidney disease Other   . Stroke Other   . Heart disease Other     BP 128/92  Pulse 58  Temp(Src) 98.3 F (36.8 C) (Oral)  Ht 5\' 11"  (1.803 m)  Wt 212 lb 9.6 oz (96.435 kg)  BMI 29.65 kg/m2  SpO2 97%   Review of Systems Denies weight change.    Objective:   Physical Exam VITAL SIGNS:  See vs page. GENERAL: no distress. NECK: There is no palpable thyroid enlargement.  No thyroid nodule is palpable.  No palpable lymphadenopathy at the anterior neck.   Lab Results  Component Value Date   TSH 10.49* 09/01/2011      Assessment & Plan:  Post-i-131 hypothyroidism, new

## 2011-09-01 NOTE — Patient Instructions (Addendum)
blood tests are being requested for you today.  please call [phone] to hear your test results.  You will be prompted to enter the 9-digit "MRN" number that appears at the top left of this page, followed by #.  Then you will hear the message. Based on the results, i may say you should take a higher amount of thyroid medication.   Please come back for a follow-up appointment in 4-6 weeks.  Please make an appointment. (update: i left message on phone-tree:  Same rx)

## 2011-10-04 ENCOUNTER — Other Ambulatory Visit (INDEPENDENT_AMBULATORY_CARE_PROVIDER_SITE_OTHER): Payer: 59

## 2011-10-04 ENCOUNTER — Ambulatory Visit (INDEPENDENT_AMBULATORY_CARE_PROVIDER_SITE_OTHER): Payer: 59 | Admitting: Endocrinology

## 2011-10-04 ENCOUNTER — Encounter: Payer: Self-pay | Admitting: Endocrinology

## 2011-10-04 VITALS — BP 130/80 | HR 57 | Temp 97.4°F | Ht 70.0 in | Wt 212.4 lb

## 2011-10-04 DIAGNOSIS — E890 Postprocedural hypothyroidism: Secondary | ICD-10-CM

## 2011-10-04 LAB — TSH: TSH: 0.43 u[IU]/mL (ref 0.35–5.50)

## 2011-10-04 LAB — T4, FREE: Free T4: 1.02 ng/dL (ref 0.60–1.60)

## 2011-10-04 MED ORDER — LEVOTHYROXINE SODIUM 100 MCG PO TABS: 100.0000 ug | ORAL_TABLET | Freq: Every day | ORAL | Status: DC

## 2011-10-04 NOTE — Patient Instructions (Addendum)
blood tests are being requested for you today.  please call [phone] to hear your test results.  You will be prompted to enter the 9-digit "MRN" number that appears at the top left of this page, followed by #.  Then you will hear the message.  Please come back for a follow-up appointment in 4-6 weeks.  Please make an appointment. (update: i left message on phone-tree:  Same rx).

## 2011-10-04 NOTE — Progress Notes (Signed)
  Subjective:    Patient ID: Christopher Howard, male    DOB: [birth year], 46 y.o.   MRN: [id number]  HPI Pt is 3 1/2 mos s/p i-131 rx, for hyperthyroidism due to grave's dz.  He takes synthroid as rx'ed.  pt states he feels no different, and well in general, except for mild constipation. Past Medical History  Diagnosis Date  . GERD (gastroesophageal reflux disease)   . Seasonal allergies   . Hyperlipidemia   . Diffuse toxic goiter     No past surgical history on file.  History   Social History  . Marital Status: Married    Spouse Name: N/A    Number of Children: N/A  . Years of Education: N/A   Occupational History  . Not on file.   Social History Main Topics  . Smoking status: Current Some Day Smoker -- 20 years    Types: Cigarettes  . Smokeless tobacco: Never Used  . Alcohol Use: No  . Drug Use: No  . Sexually Active: Not on file   Other Topics Concern  . Not on file   Social History Narrative   Regular exercise-yes    No current outpatient prescriptions on file prior to visit.    Allergies  Allergen Reactions  . Ketoconazole     Family History  Problem Relation Age of Onset  . Alcohol abuse Other   . Arthritis Other   . Cancer Other     colon  . Diabetes Other   . Kidney disease Other   . Stroke Other   . Heart disease Other     BP 130/80  Pulse 57  Temp(Src) 97.4 F (36.3 C) (Oral)  Ht 5\' 10"  (1.778 m)  Wt 212 lb 6.4 oz (96.344 kg)  BMI 30.48 kg/m2  SpO2 97%    Review of Systems Denies weight change    Objective:   Physical Exam VITAL SIGNS:  See vs page GENERAL: no distress NECK: There is no palpable thyroid enlargement.  No thyroid nodule is palpable.  No palpable lymphadenopathy at the anterior neck.   Lab Results  Component Value Date   TSH 0.43 10/04/2011      Assessment & Plan:  Post-i-131 hypothyroidism, well-replaced

## 2011-11-01 ENCOUNTER — Other Ambulatory Visit (INDEPENDENT_AMBULATORY_CARE_PROVIDER_SITE_OTHER): Payer: 59

## 2011-11-01 ENCOUNTER — Ambulatory Visit (INDEPENDENT_AMBULATORY_CARE_PROVIDER_SITE_OTHER): Payer: 59 | Admitting: Endocrinology

## 2011-11-01 ENCOUNTER — Encounter: Payer: Self-pay | Admitting: Endocrinology

## 2011-11-01 VITALS — BP 118/82 | HR 65 | Temp 97.5°F | Ht 70.0 in | Wt 216.0 lb

## 2011-11-01 DIAGNOSIS — E890 Postprocedural hypothyroidism: Secondary | ICD-10-CM

## 2011-11-01 MED ORDER — LEVOTHYROXINE SODIUM 50 MCG PO TABS: 50.0000 ug | ORAL_TABLET | Freq: Every day | ORAL | Status: DC

## 2011-11-01 NOTE — Patient Instructions (Addendum)
blood tests are being requested for you today.  please call [phone] to hear your test results.  You will be prompted to enter the 9-digit "MRN" number that appears at the top left of this page, followed by #.  Then you will hear the message.  Please come back for a follow-up appointment in 2 months. (update: i left message on phone-tree: skip 3 days, then resume at 50 mcg/d)

## 2011-11-01 NOTE — Progress Notes (Signed)
  Subjective:    Patient ID: Christopher Howard, male    DOB: 03-01-1966, 46 y.o.   MRN: [id number]  HPI Pt is 4 1/2 mos s/p i-131 rx, for hyperthyroidism due to grave's dz.  He takes synthroid as rx'ed.  pt states he feels no different, and well in general, except for mild weight gain.   Past Medical History  Diagnosis Date  . GERD (gastroesophageal reflux disease)   . Seasonal allergies   . Hyperlipidemia   . Diffuse toxic goiter     No past surgical history on file.  History   Social History  . Marital Status: Married    Spouse Name: N/A    Number of Children: N/A  . Years of Education: N/A   Occupational History  . Not on file.   Social History Main Topics  . Smoking status: Current Some Day Smoker -- 20 years    Types: Cigarettes  . Smokeless tobacco: Never Used  . Alcohol Use: No  . Drug Use: No  . Sexually Active: Not on file   Other Topics Concern  . Not on file   Social History Narrative   Regular exercise-yes    No current outpatient prescriptions on file prior to visit.    Allergies  Allergen Reactions  . Ketoconazole     Family History  Problem Relation Age of Onset  . Alcohol abuse Other   . Arthritis Other   . Cancer Other     colon  . Diabetes Other   . Kidney disease Other   . Stroke Other   . Heart disease Other     BP 118/82  Pulse 65  Temp(Src) 97.5 F (36.4 C) (Oral)  Ht 5\' 10"  (1.778 m)  Wt 216 lb (97.977 kg)  BMI 30.99 kg/m2  SpO2 99%    Review of Systems Denies depression.      Objective:   Physical Exam VITAL SIGNS:  See vs page GENERAL: no distress NECK: There is no palpable thyroid enlargement.  No thyroid nodule is palpable.  No palpable lymphadenopathy at the anterior neck.  Lab Results  Component Value Date   TSH 0.04* 11/01/2011      Assessment & Plan:  Post-i-131 hypothyroidism, slightly overcontrolled

## 2012-01-03 ENCOUNTER — Ambulatory Visit (INDEPENDENT_AMBULATORY_CARE_PROVIDER_SITE_OTHER): Payer: 59 | Admitting: Endocrinology

## 2012-01-03 ENCOUNTER — Other Ambulatory Visit (INDEPENDENT_AMBULATORY_CARE_PROVIDER_SITE_OTHER): Payer: 59

## 2012-01-03 ENCOUNTER — Encounter: Payer: Self-pay | Admitting: Endocrinology

## 2012-01-03 VITALS — BP 100/72 | HR 63 | Temp 97.7°F | Ht 70.0 in | Wt 213.0 lb

## 2012-01-03 DIAGNOSIS — E890 Postprocedural hypothyroidism: Secondary | ICD-10-CM

## 2012-01-03 MED ORDER — LEVOTHYROXINE SODIUM 25 MCG PO TABS: 25.0000 ug | ORAL_TABLET | Freq: Every day | ORAL | Status: DC

## 2012-01-03 NOTE — Patient Instructions (Addendum)
blood tests are being requested for you today.  You will receive a letter with results. Please come back for a follow-up appointment in 2 months.   

## 2012-01-03 NOTE — Progress Notes (Signed)
  Subjective:    Patient ID: Christopher Howard, male    DOB: 09/24/1965, 46 y.o.   MRN: [id number]  HPI Pt is 6 1/2 mos s/p i-131 rx, for hyperthyroidism due to grave's dz.  He takes synthroid as rx'ed.  pt states he feels no different, and well in general, except for recent gi illness.  He takes synthroid qd--alternating 50 and 100 mcg.   Past Medical History  Diagnosis Date  . GERD (gastroesophageal reflux disease)   . Seasonal allergies   . Hyperlipidemia   . Diffuse toxic goiter     No past surgical history on file.  History   Social History  . Marital Status: Married    Spouse Name: N/A    Number of Children: N/A  . Years of Education: N/A   Occupational History  . Not on file.   Social History Main Topics  . Smoking status: Current Some Day Smoker -- 20 years    Types: Cigarettes  . Smokeless tobacco: Never Used  . Alcohol Use: No  . Drug Use: No  . Sexually Active: Not on file   Other Topics Concern  . Not on file   Social History Narrative   Regular exercise-yes    No current outpatient prescriptions on file prior to visit.    Allergies  Allergen Reactions  . Ketoconazole     Family History  Problem Relation Age of Onset  . Alcohol abuse Other   . Arthritis Other   . Cancer Other     colon  . Diabetes Other   . Kidney disease Other   . Stroke Other   . Heart disease Other     BP 100/72  Pulse 63  Temp(Src) 97.7 F (36.5 C) (Oral)  Ht 5\' 10"  (1.778 m)  Wt 213 lb (96.616 kg)  BMI 30.56 kg/m2  SpO2 96%    Review of Systems He has lot a few lbs.    Objective:   Physical Exam VITAL SIGNS:  See vs page GENERAL: no distress NECK: There is no palpable thyroid enlargement.  No thyroid nodule is palpable.  No palpable lymphadenopathy at the anterior neck.   Lab Results  Component Value Date   TSH 0.04* 01/03/2012      Assessment & Plan:  Post-i-131 hypothyroidism, overreplaced.  He is at risk for recurrent hyperthyroidism

## 2012-01-04 ENCOUNTER — Telehealth: Payer: Self-pay | Admitting: *Deleted

## 2012-01-04 NOTE — Telephone Encounter (Signed)
Called pt to inform of lab results, pt informed. (Letter also mailed to pt) Pt wants MD to be aware that he was taking Levothyroxine every other day alternating with Levothyroxine .

## 2012-01-04 NOTE — Telephone Encounter (Signed)
Pt informed that MD is aware and transferred to scheduler to set up appointment.

## 2012-01-04 NOTE — Telephone Encounter (Signed)
Yes, i am aware

## 2012-02-20 ENCOUNTER — Encounter: Payer: Self-pay | Admitting: Endocrinology

## 2012-02-20 ENCOUNTER — Ambulatory Visit (INDEPENDENT_AMBULATORY_CARE_PROVIDER_SITE_OTHER): Payer: 59 | Admitting: Endocrinology

## 2012-02-20 ENCOUNTER — Other Ambulatory Visit (INDEPENDENT_AMBULATORY_CARE_PROVIDER_SITE_OTHER): Payer: 59

## 2012-02-20 VITALS — BP 134/88 | HR 57 | Temp 96.7°F | Ht 70.0 in | Wt 219.0 lb

## 2012-02-20 DIAGNOSIS — E890 Postprocedural hypothyroidism: Secondary | ICD-10-CM

## 2012-02-20 NOTE — Progress Notes (Signed)
  Subjective:    Patient ID: Christopher Howard, male    DOB: [birth year], 46 y.o.   MRN: [id number]  HPI Pt is 8 mos s/p i-131 rx, for hyperthyroidism due to grave's dz.  He takes synthroid as rx'ed.  pt states he feels no different, and well in general, except for weight gain.  He has reduced synthroid to 25 mcg/day.   Past Medical History  Diagnosis Date  . GERD (gastroesophageal reflux disease)   . Seasonal allergies   . Hyperlipidemia   . Diffuse toxic goiter     No past surgical history on file.  History   Social History  . Marital Status: Married    Spouse Name: N/A    Number of Children: N/A  . Years of Education: N/A   Occupational History  . Not on file.   Social History Main Topics  . Smoking status: Current Some Day Smoker -- 20 years    Types: Cigarettes  . Smokeless tobacco: Never Used  . Alcohol Use: No  . Drug Use: No  . Sexually Active: Not on file   Other Topics Concern  . Not on file   Social History Narrative   Regular exercise-yes    Current Outpatient Prescriptions on File Prior to Visit  Medication Sig Dispense Refill  . levothyroxine (SYNTHROID, LEVOTHROID) 25 MCG tablet Take 1 tablet (25 mcg total) by mouth daily.  30 tablet  4    Allergies  Allergen Reactions  . Ketoconazole     Family History  Problem Relation Age of Onset  . Alcohol abuse Other   . Arthritis Other   . Cancer Other     colon  . Diabetes Other   . Kidney disease Other   . Stroke Other   . Heart disease Other     BP 134/88  Pulse 57  Temp 96.7 F (35.9 C) (Oral)  Ht 5\' 10"  (1.778 m)  Wt 219 lb (99.338 kg)  BMI 31.42 kg/m2  SpO2 97%  Review of Systems Denies tremor    Objective:   Physical Exam VITAL SIGNS:  See vs page GENERAL: no distress NECK: There is no palpable thyroid enlargement.  No thyroid nodule is palpable.  No palpable lymphadenopathy at the anterior neck.   Lab Results  Component Value Date   TSH 3.31 02/20/2012      Assessment &  Plan:  Post-i-131 hypothyroidism, well-replaced at a low dosage of synthroid.  He is at high risk for recurrence of hyperthyroidism.

## 2012-02-20 NOTE — Patient Instructions (Addendum)
blood tests are being requested for you today.  We'll call you with results.    

## 2012-05-13 ENCOUNTER — Encounter: Payer: Self-pay | Admitting: Endocrinology

## 2012-05-13 ENCOUNTER — Ambulatory Visit (INDEPENDENT_AMBULATORY_CARE_PROVIDER_SITE_OTHER): Payer: 59 | Admitting: Endocrinology

## 2012-05-13 ENCOUNTER — Other Ambulatory Visit (INDEPENDENT_AMBULATORY_CARE_PROVIDER_SITE_OTHER): Payer: 59

## 2012-05-13 VITALS — BP 130/90 | Temp 97.3°F | Resp 66 | Ht 69.5 in | Wt 222.6 lb

## 2012-05-13 DIAGNOSIS — E890 Postprocedural hypothyroidism: Secondary | ICD-10-CM

## 2012-05-13 LAB — TSH: TSH: 2.97 u[IU]/mL (ref 0.35–5.50)

## 2012-05-13 NOTE — Patient Instructions (Addendum)
blood tests are being requested for you today.  You will receive a letter with results. Please come back for a follow-up appointment in 6 months. Your thyroid will possibly become overactive again.  this is usually a slow process, happening over the span of many years.

## 2012-05-13 NOTE — Progress Notes (Signed)
  Subjective:    Patient ID: Christopher Howard, male    DOB: 15-Apr-1966, 46 y.o.   MRN: [id number]  HPI Pt is 11 mos s/p i-131 rx, for hyperthyroidism due to grave's dz.  He said at his last ov that he was taking synthroid 25 mcg/day, but he now says he was taking 50/day.  He now takes the same. pt states he feels well in general, except for constipation Past Medical History  Diagnosis Date  . GERD (gastroesophageal reflux disease)   . Seasonal allergies   . Hyperlipidemia   . Diffuse toxic goiter     No past surgical history on file.  History   Social History  . Marital Status: Married    Spouse Name: N/A    Number of Children: N/A  . Years of Education: N/A   Occupational History  . Not on file.   Social History Main Topics  . Smoking status: Current Some Day Smoker -- 20 years    Types: Cigarettes  . Smokeless tobacco: Never Used  . Alcohol Use: No  . Drug Use: No  . Sexually Active: Not on file   Other Topics Concern  . Not on file   Social History Narrative   Regular exercise-yes    No current outpatient prescriptions on file prior to visit.    Allergies  Allergen Reactions  . Ketoconazole     Family History  Problem Relation Age of Onset  . Alcohol abuse Other   . Arthritis Other   . Cancer Other     colon  . Diabetes Other   . Kidney disease Other   . Stroke Other   . Heart disease Other     BP 130/90  Temp 97.3 F (36.3 C)  Resp 66  Ht 5' 9.5" (1.765 m)  Wt 222 lb 9.6 oz (100.971 kg)  BMI 32.40 kg/m2  SpO2 95%    Review of Systems He has gained a few lbs    Objective:   Physical Exam VITAL SIGNS:  See vs page GENERAL: no distress NECK: There is no palpable thyroid enlargement.  No thyroid nodule is palpable.  No palpable lymphadenopathy at the anterior neck.     Assessment & Plan:  Post-i-131 hypothyroidism.  Because he is replaced with less than a full daily dosage, he is at risk for recurrent hyperthyroidism

## 2012-05-22 ENCOUNTER — Encounter: Payer: Self-pay | Admitting: Endocrinology

## 2012-05-22 ENCOUNTER — Telehealth: Payer: Self-pay | Admitting: *Deleted

## 2012-05-22 NOTE — Telephone Encounter (Signed)
ERROR IN CHARTING 

## 2012-05-28 ENCOUNTER — Other Ambulatory Visit: Payer: Self-pay | Admitting: Endocrinology

## 2012-06-11 ENCOUNTER — Other Ambulatory Visit (INDEPENDENT_AMBULATORY_CARE_PROVIDER_SITE_OTHER): Payer: 59

## 2012-06-11 DIAGNOSIS — Z0000 Encounter for general adult medical examination without abnormal findings: Secondary | ICD-10-CM

## 2012-06-11 LAB — CBC WITH DIFFERENTIAL/PLATELET
Basophils Relative: 0.8 % (ref 0.0–3.0)
Eosinophils Relative: 3.4 % (ref 0.0–5.0)
Hemoglobin: 14.8 g/dL (ref 13.0–17.0)
Lymphocytes Relative: 29.1 % (ref 12.0–46.0)
Monocytes Relative: 9.4 % (ref 3.0–12.0)
Neutro Abs: 2.4 10*3/uL (ref 1.4–7.7)
Neutrophils Relative %: 57.3 % (ref 43.0–77.0)
RBC: 5.18 Mil/uL (ref 4.22–5.81)
WBC: 4.2 10*3/uL — ABNORMAL LOW (ref 4.5–10.5)

## 2012-06-11 LAB — BASIC METABOLIC PANEL
Calcium: 8.9 mg/dL (ref 8.4–10.5)
Creatinine, Ser: 1.2 mg/dL (ref 0.4–1.5)
Sodium: 139 mEq/L (ref 135–145)

## 2012-06-11 LAB — HEPATIC FUNCTION PANEL
ALT: 16 U/L (ref 0–53)
AST: 16 U/L (ref 0–37)
Alkaline Phosphatase: 79 U/L (ref 39–117)
Bilirubin, Direct: 0.1 mg/dL (ref 0.0–0.3)
Total Protein: 7.2 g/dL (ref 6.0–8.3)

## 2012-06-11 LAB — LIPID PANEL
Cholesterol: 194 mg/dL (ref 0–200)
LDL Cholesterol: 127 mg/dL — ABNORMAL HIGH (ref 0–99)
Total CHOL/HDL Ratio: 4
VLDL: 18.8 mg/dL (ref 0.0–40.0)

## 2012-06-11 LAB — POCT URINALYSIS DIPSTICK
Blood, UA: NEGATIVE
Ketones, UA: NEGATIVE
Protein, UA: NEGATIVE
Spec Grav, UA: >=1.03
pH, UA: 5

## 2012-06-18 ENCOUNTER — Encounter: Payer: Self-pay | Admitting: Family Medicine

## 2012-06-18 ENCOUNTER — Ambulatory Visit (INDEPENDENT_AMBULATORY_CARE_PROVIDER_SITE_OTHER): Payer: 59 | Admitting: Family Medicine

## 2012-06-18 VITALS — BP 140/82 | HR 86 | Temp 98.6°F | Ht 70.0 in | Wt 231.0 lb

## 2012-06-18 DIAGNOSIS — Z0000 Encounter for general adult medical examination without abnormal findings: Secondary | ICD-10-CM

## 2012-06-18 NOTE — Progress Notes (Signed)
  Subjective:    Patient ID: Christopher Howard, male    DOB: Aug 08, 1966, 46 y.o.   MRN: [id number]  HPI 46 yr old male for a cpx. He feels fine although he is struggling to lose weight. He has had I-131 ablation of his thyroid and is on supplementation now per Dr. Everardo All.    Review of Systems  Constitutional: Negative.   HENT: Negative.   Eyes: Negative.   Respiratory: Negative.   Cardiovascular: Negative.   Gastrointestinal: Negative.   Genitourinary: Negative.   Musculoskeletal: Negative.   Skin: Negative.   Neurological: Negative.   Hematological: Negative.   Psychiatric/Behavioral: Negative.        Objective:   Physical Exam  Constitutional: He is oriented to person, place, and time. He appears well-developed and well-nourished. No distress.  HENT:  Head: Normocephalic and atraumatic.  Right Ear: External ear normal.  Left Ear: External ear normal.  Nose: Nose normal.  Mouth/Throat: Oropharynx is clear and moist. No oropharyngeal exudate.  Eyes: Conjunctivae normal and EOM are normal. Pupils are equal, round, and reactive to light. Right eye exhibits no discharge. Left eye exhibits no discharge. No scleral icterus.  Neck: Neck supple. No JVD present. No tracheal deviation present. No thyromegaly present.  Cardiovascular: Normal rate, regular rhythm, normal heart sounds and intact distal pulses.  Exam reveals no gallop and no friction rub.   No murmur heard. Pulmonary/Chest: Effort normal and breath sounds normal. No respiratory distress. He has no wheezes. He has no rales. He exhibits no tenderness.  Abdominal: Soft. Bowel sounds are normal. He exhibits no distension and no mass. There is no tenderness. There is no rebound and no guarding.  Genitourinary: Rectum normal, prostate normal and penis normal. Guaiac negative stool. No penile tenderness.  Musculoskeletal: Normal range of motion. He exhibits no edema and no tenderness.  Lymphadenopathy:    He has no cervical  adenopathy.  Neurological: He is alert and oriented to person, place, and time. He has normal reflexes. No cranial nerve deficit. He exhibits normal muscle tone. Coordination normal.  Skin: Skin is warm and dry. No rash noted. He is not diaphoretic. No erythema. No pallor.  Psychiatric: He has a normal mood and affect. His behavior is normal. Judgment and thought content normal.          Assessment & Plan:  Well exam. Get a PSA today. Get regular exercise

## 2012-10-08 ENCOUNTER — Encounter: Payer: Self-pay | Admitting: *Deleted

## 2012-10-08 ENCOUNTER — Emergency Department
Admission: EM | Admit: 2012-10-08 | Discharge: 2012-10-08 | Disposition: A | Discharge status: Home / Self Care | Payer: Self-pay | Source: Home / Self Care | Attending: Family Medicine | Admitting: Family Medicine

## 2012-10-08 DIAGNOSIS — R030 Elevated blood-pressure reading, without diagnosis of hypertension: Secondary | ICD-10-CM

## 2012-10-08 DIAGNOSIS — Z716 Tobacco abuse counseling: Secondary | ICD-10-CM

## 2012-10-08 LAB — CBC WITH DIFFERENTIAL/PLATELET
Eosinophils Absolute: 0.2 10*3/uL (ref 0.0–0.7)
Eosinophils Relative: 3 % (ref 0–5)
Hemoglobin: 14.4 g/dL (ref 13.0–17.0)
Lymphocytes Relative: 36 % (ref 12–46)
Lymphs Abs: 1.8 10*3/uL (ref 0.7–4.0)
MCH: 28.5 pg (ref 26.0–34.0)
MCV: 83.2 fL (ref 78.0–100.0)
Monocytes Relative: 9 % (ref 3–12)
Neutrophils Relative %: 52 % (ref 43–77)
RBC: 5.06 MIL/uL (ref 4.22–5.81)
WBC: 4.9 10*3/uL (ref 4.0–10.5)

## 2012-10-08 NOTE — ED Provider Notes (Signed)
History     CSN: [id number]  Arrival date & time 10/08/12  1415   First MD Initiated Contact with Patient 10/08/12 1420      Chief Complaint  Patient presents with  . Hypertension   HPI Patient presents today with chief complaint of hypertension. Patient was at a health fair today we have his blood pressure checked patient reported to have a blood pressure in the 150s/100s. Patient was then referred here for evaluation. Patient denies any chest pain shortness of breath headache. Patient states that he drank approximately 3 cups of coffee today. Patient believes may be related to his high blood pressure. Patient also reports he was started on Synthroid for thyroid issues by his PCP recently. Patient denies any prior history of high blood pressure in the past although he says he has had mildly elevated pressure readings in the past. Patient is currently not on medication for this. Patient denies any family history of early heart attack although there is a family history of MI with his father in his late 18s. Patient is also currently intermittent smoker.     Past Medical History  Diagnosis Date  . GERD (gastroesophageal reflux disease)   . Seasonal allergies   . Hyperlipidemia   . 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 Hypothyroidism (acquired)     sees Dr. Everardo All     Past Surgical History  Procedure Laterality Date  . Thyroidectomy    . Wisdom tooth extraction      Family History  Problem Relation Age of Onset  . Alcohol abuse Other   . Arthritis Other   . Cancer Other     colon  . Diabetes Other   . Kidney disease Other   . Stroke Other   . Heart disease Other   . Heart disease Mother   . Heart attack Father     History  Substance Use Topics  . Smoking status: Current Some Day Smoker -- 20 years    Types: Cigarettes  . Smokeless tobacco: Never Used  . Alcohol Use: No      Review of Systems  All other systems reviewed and are  negative.    Allergies  Ketoconazole  Home Medications   Current Outpatient Rx  Name  Route  Sig  Dispense  Refill  . levothyroxine (SYNTHROID, LEVOTHROID) 50 MCG tablet   Oral   Take 75 mcg by mouth daily.            BP 152/101  Pulse 65  Temp(Src) 97.9 F (36.6 C) (Oral)  Ht 5\' 10"  (1.778 m)  Wt 229 lb (103.874 kg)  BMI 32.86 kg/m2  SpO2 97%  Physical Exam  Constitutional: He appears well-developed and well-nourished.  HENT:  Head: Normocephalic and atraumatic.  Eyes: Conjunctivae are normal. Pupils are equal, round, and reactive to light.  Neck: Normal range of motion. Neck supple.  Cardiovascular: Normal rate, regular rhythm and normal heart sounds.   Pulmonary/Chest: Effort normal and breath sounds normal.  Abdominal: Soft.  Musculoskeletal: Normal range of motion.  Neurological: He is alert.  Skin: Skin is warm.    ED Course  Procedures (including critical care time)  Labs Reviewed  LDL CHOLESTEROL, DIRECT  TSH  CBC WITH DIFFERENTIAL  COMPLETE METABOLIC PANEL WITH GFR  HEMOGLOBIN A1C   EKG: normal EKG, normal sinus rhythm, no ST or T wave abnormalities.  No results found.   1. Elevated BP   2. Tobacco  abuse counseling       MDM  EKG physical exam within normal limits. Will check general risk stratification labs including direct LDL and A1c. Discussed smoking cessation at length as well as diet and lifestyle changes. Handout given on hypertension smoking cessation for review with patient. Patient plans to followup with his PCP within the next week for general reevaluation. Will withhold on starting medication today's patient is currently asymptomatic.  Cardiovascular red flags were discussed at length with patient.    The patient and/or caregiver has been counseled thoroughly with regard to treatment plan and/or medications prescribed including dosage, schedule, interactions, rationale for use, and possible side effects and they verbalize  understanding. Diagnoses and expected course of recovery discussed and will return if not improved as expected or if the condition worsens. Patient and/or caregiver verbalized understanding.              Doree Albee, MD 10/18/12 [phone]

## 2012-10-08 NOTE — ED Notes (Signed)
Pt reports having his blood pressure checked at a health fair today with a reading of 165/128 initially. He reports some blurred vision with his glasses on. Denies SOB, chest pain, HA, and nausea.

## 2012-10-09 ENCOUNTER — Telehealth: Payer: Self-pay | Admitting: *Deleted

## 2012-10-09 LAB — COMPLETE METABOLIC PANEL WITH GFR
ALT: 15 U/L (ref 0–53)
CO2: 23 mEq/L (ref 19–32)
Calcium: 9 mg/dL (ref 8.4–10.5)
Chloride: 106 mEq/L (ref 96–112)
GFR, Est African American: >89 mL/min
GFR, Est Non African American: >89 mL/min
Glucose, Bld: 76 mg/dL (ref 70–99)
Sodium: 138 mEq/L (ref 135–145)
Total Bilirubin: 0.3 mg/dL (ref 0.3–1.2)
Total Protein: 7.2 g/dL (ref 6.0–8.3)

## 2012-10-14 ENCOUNTER — Ambulatory Visit (INDEPENDENT_AMBULATORY_CARE_PROVIDER_SITE_OTHER): Payer: 59 | Admitting: Family Medicine

## 2012-10-14 ENCOUNTER — Encounter: Payer: Self-pay | Admitting: Family Medicine

## 2012-10-14 VITALS — BP 150/96 | HR 87 | Temp 98.2°F | Wt 230.0 lb

## 2012-10-14 DIAGNOSIS — I10 Essential (primary) hypertension: Secondary | ICD-10-CM

## 2012-10-14 DIAGNOSIS — E890 Postprocedural hypothyroidism: Secondary | ICD-10-CM

## 2012-10-14 MED ORDER — LISINOPRIL-HYDROCHLOROTHIAZIDE 10-12.5 MG PO TABS: 1.0000 | ORAL_TABLET | Freq: Every day | ORAL | Status: DC

## 2012-10-14 NOTE — Progress Notes (Signed)
  Subjective:    Patient ID: Christopher Howard, male    DOB: [birth year], 47 y.o.   MRN: [id number]  HPI Here for high BP. He had a borderline BP when he was here 6 months ago for his cpx. Then he was treated by ablation for hyperthyroidism, managed by Dr. Everardo All. He has been on Synthroid since then. He had gained some weight and then lost that, so that he is back to his baseline now. He was seen in Urgent Care a week ago for a BP of 152/102. His exam, EKG, and labs were all normal. He feels fine except for generalized fatigue. No HA or SOB or chest pain. Both of his parents have HTN.    Review of Systems  Constitutional: Positive for fatigue. Negative for activity change, appetite change and unexpected weight change.  Respiratory: Negative.   Cardiovascular: Negative.   Endocrine: Negative.   Skin: Negative.   Neurological: Negative.        Objective:   Physical Exam  Constitutional: He is oriented to person, place, and time. He appears well-developed and well-nourished.  Neck: No thyromegaly present.  Cardiovascular: Normal rate, regular rhythm, normal heart sounds and intact distal pulses.   Pulmonary/Chest: Effort normal and breath sounds normal.  Lymphadenopathy:    He has no cervical adenopathy.  Neurological: He is alert and oriented to person, place, and time.          Assessment & Plan:  He has HTN which now needs to be treated. We discussed losing weight and limiting dietary sodium. Start on Lisinopril HCT and recheck in one month

## 2012-10-28 ENCOUNTER — Encounter: Payer: Self-pay | Admitting: Endocrinology

## 2012-10-28 ENCOUNTER — Ambulatory Visit (INDEPENDENT_AMBULATORY_CARE_PROVIDER_SITE_OTHER): Payer: 59 | Admitting: Endocrinology

## 2012-10-28 VITALS — BP 138/88 | HR 65 | Wt 231.0 lb

## 2012-10-28 DIAGNOSIS — E890 Postprocedural hypothyroidism: Secondary | ICD-10-CM

## 2012-10-28 MED ORDER — LEVOTHYROXINE SODIUM 75 MCG PO TABS: 75.0000 ug | ORAL_TABLET | Freq: Every day | ORAL | Status: DC

## 2012-10-28 NOTE — Progress Notes (Signed)
  Subjective:    Patient ID: Christopher Howard, male    DOB: November 03, 1965, 47 y.o.   MRN: [id number]  HPI Pt had i-131 rx for hyperthyroidism due to grave's dz, in late 2012.  He takes a varying dosage of synthroid, using old pills. pt states fatigue.   Past Medical History  Diagnosis Date  . GERD (gastroesophageal reflux disease)   . Seasonal allergies   . Hyperlipidemia   . 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 Hypothyroidism (acquired)     sees Dr. Everardo All     Past Surgical History  Procedure Laterality Date  . Thyroidectomy    . Wisdom tooth extraction      History   Social History  . Marital Status: Married    Spouse Name: N/A    Number of Children: N/A  . Years of Education: N/A   Occupational History  . Not on file.   Social History Main Topics  . Smoking status: Current Some Day Smoker -- 20 years    Types: Cigarettes  . Smokeless tobacco: Never Used     Comment: 3 cigarettes per day  . Alcohol Use: No  . Drug Use: No  . Sexually Active: Not on file   Other Topics Concern  . Not on file   Social History Narrative   Regular exercise-yes    Current Outpatient Prescriptions on File Prior to Visit  Medication Sig Dispense Refill  . lisinopril-hydrochlorothiazide (PRINZIDE,ZESTORETIC) 10-12.5 MG per tablet Take 1 tablet by mouth daily.  90 tablet  3   No current facility-administered medications on file prior to visit.    Allergies  Allergen Reactions  . Ketoconazole     Family History  Problem Relation Age of Onset  . Alcohol abuse Other   . Arthritis Other   . Cancer Other     colon  . Diabetes Other   . Kidney disease Other   . Stroke Other   . Heart disease Other   . Heart disease Mother   . Heart attack Father    BP 138/88  Pulse 65  Wt 231 lb (104.781 kg)  BMI 33.15 kg/m2  SpO2 96%  Review of Systems Pt reports weight gain    Objective:   Physical Exam VITAL SIGNS:  See vs page. GENERAL: no  distress. NECK: There is no palpable thyroid enlargement.  No thyroid nodule is palpable.  No palpable lymphadenopathy at the anterior neck.   Lab Results  Component Value Date   TSH 3.276 10/08/2012      Assessment & Plan:  Post-i-131 hypothyroidism, well-replaced.

## 2012-10-28 NOTE — Patient Instructions (Addendum)
i have sent a prescription to your pharmacy, for 75 mcg/day.   Your thyroid will possibly become overactive again, so you should recheck the blood test every 6 months.    I would be happy to see you back here whenever you want.    Please repeat the blood test in 1 month.

## 2012-11-11 ENCOUNTER — Encounter: Payer: Self-pay | Admitting: Family Medicine

## 2012-11-11 ENCOUNTER — Ambulatory Visit (INDEPENDENT_AMBULATORY_CARE_PROVIDER_SITE_OTHER): Payer: 59 | Admitting: Family Medicine

## 2012-11-11 VITALS — BP 130/80 | HR 90 | Temp 97.8°F | Wt 236.0 lb

## 2012-11-11 DIAGNOSIS — I10 Essential (primary) hypertension: Secondary | ICD-10-CM

## 2012-11-11 NOTE — Progress Notes (Signed)
  Subjective:    Patient ID: Christopher Howard, male    DOB: [birth year], 47 y.o.   MRN: [id number]  HPI Here to follow up HTN. He has tolerated the med well and his BP [place] now stable. He has seen Dr. Everardo All and they are adjusting his thyroid med.    Review of Systems  Constitutional: Negative.   Respiratory: Negative.   Cardiovascular: Negative.        Objective:   Physical Exam  Constitutional: He appears well-developed and well-nourished.  Cardiovascular: Normal rate, regular rhythm, normal heart sounds and intact distal pulses.   Pulmonary/Chest: Effort normal and breath sounds normal.          Assessment & Plan:  His HTN is well controlled.

## 2012-11-26 ENCOUNTER — Other Ambulatory Visit (INDEPENDENT_AMBULATORY_CARE_PROVIDER_SITE_OTHER): Payer: 59

## 2012-11-26 DIAGNOSIS — E890 Postprocedural hypothyroidism: Secondary | ICD-10-CM

## 2012-11-26 LAB — TSH: TSH: 0.6 u[IU]/mL (ref 0.35–5.50)

## 2013-06-26 ENCOUNTER — Other Ambulatory Visit: Payer: Self-pay

## 2013-11-27 ENCOUNTER — Other Ambulatory Visit (INDEPENDENT_AMBULATORY_CARE_PROVIDER_SITE_OTHER): Payer: 59

## 2013-11-27 DIAGNOSIS — Z0000 Encounter for general adult medical examination without abnormal findings: Secondary | ICD-10-CM

## 2013-11-27 LAB — BASIC METABOLIC PANEL
BUN: 19 mg/dL (ref 6–23)
CO2: 27 mEq/L (ref 19–32)
Calcium: 8.8 mg/dL (ref 8.4–10.5)
Chloride: 106 mEq/L (ref 96–112)
Creatinine, Ser: 1.2 mg/dL (ref 0.4–1.5)
GFR: 80.8 mL/min (ref >60.00)
Glucose, Bld: 74 mg/dL (ref 70–99)
POTASSIUM: 3.8 meq/L (ref 3.5–5.1)
SODIUM: 139 meq/L (ref 135–145)

## 2013-11-27 LAB — CBC WITH DIFFERENTIAL/PLATELET
Basophils Absolute: 0 10*3/uL (ref 0.0–0.1)
Basophils Relative: 0.4 % (ref 0.0–3.0)
EOS PCT: 1.9 % (ref 0.0–5.0)
Eosinophils Absolute: 0.1 10*3/uL (ref 0.0–0.7)
HEMATOCRIT: 39.4 % (ref 39.0–52.0)
HEMOGLOBIN: 13.1 g/dL (ref 13.0–17.0)
LYMPHS ABS: 1.2 10*3/uL (ref 0.7–4.0)
Lymphocytes Relative: 21.9 % (ref 12.0–46.0)
MCHC: 33.3 g/dL (ref 30.0–36.0)
MCV: 86.6 fl (ref 78.0–100.0)
MONOS PCT: 8.3 % (ref 3.0–12.0)
Monocytes Absolute: 0.5 10*3/uL (ref 0.1–1.0)
NEUTROS ABS: 3.8 10*3/uL (ref 1.4–7.7)
Neutrophils Relative %: 67.5 % (ref 43.0–77.0)
Platelets: 183 10*3/uL (ref 150.0–400.0)
RBC: 4.55 Mil/uL (ref 4.22–5.81)
RDW: 14.9 % — ABNORMAL HIGH (ref 11.5–14.6)
WBC: 5.7 10*3/uL (ref 4.5–10.5)

## 2013-11-27 LAB — POCT URINALYSIS DIPSTICK
Bilirubin, UA: NEGATIVE
GLUCOSE UA: NEGATIVE
Ketones, UA: NEGATIVE
Leukocytes, UA: NEGATIVE
Nitrite, UA: NEGATIVE
Protein, UA: NEGATIVE
RBC UA: NEGATIVE
UROBILINOGEN UA: 0.2
pH, UA: 5.5

## 2013-11-27 LAB — LIPID PANEL
CHOL/HDL RATIO: 4
Cholesterol: 191 mg/dL (ref 0–200)
HDL: 50.9 mg/dL (ref >39.00)
LDL CALC: 130 mg/dL — AB (ref 0–99)
TRIGLYCERIDES: 49 mg/dL (ref 0.0–149.0)
VLDL: 9.8 mg/dL (ref 0.0–40.0)

## 2013-11-27 LAB — HEPATIC FUNCTION PANEL
ALBUMIN: 3.9 g/dL (ref 3.5–5.2)
ALT: 18 U/L (ref 0–53)
AST: 24 U/L (ref 0–37)
Alkaline Phosphatase: 56 U/L (ref 39–117)
BILIRUBIN TOTAL: 0.9 mg/dL (ref 0.3–1.2)
Bilirubin, Direct: 0.1 mg/dL (ref 0.0–0.3)
Total Protein: 6.7 g/dL (ref 6.0–8.3)

## 2013-11-27 LAB — TSH: TSH: 3.62 u[IU]/mL (ref 0.35–5.50)

## 2013-11-27 LAB — PSA: PSA: 1.11 ng/mL (ref 0.10–4.00)

## 2013-12-04 ENCOUNTER — Other Ambulatory Visit: Payer: Self-pay | Admitting: Family Medicine

## 2013-12-08 ENCOUNTER — Ambulatory Visit (INDEPENDENT_AMBULATORY_CARE_PROVIDER_SITE_OTHER): Payer: 59 | Admitting: Family Medicine

## 2013-12-08 ENCOUNTER — Encounter: Payer: Self-pay | Admitting: Family Medicine

## 2013-12-08 VITALS — BP 130/88 | HR 77 | Temp 98.1°F | Ht 69.0 in | Wt 240.0 lb

## 2013-12-08 DIAGNOSIS — Z0000 Encounter for general adult medical examination without abnormal findings: Secondary | ICD-10-CM

## 2013-12-08 MED ORDER — LEVOTHYROXINE SODIUM 100 MCG PO TABS: 100.0000 ug | ORAL_TABLET | Freq: Every day | ORAL | Status: DC

## 2013-12-08 MED ORDER — LISINOPRIL-HYDROCHLOROTHIAZIDE 10-12.5 MG PO TABS: ORAL_TABLET | ORAL | Status: DC

## 2013-12-08 NOTE — Progress Notes (Signed)
Pre visit review using our clinic review tool, if applicable. No additional management support is needed unless otherwise documented below in the visit note. 

## 2013-12-08 NOTE — Progress Notes (Signed)
   Subjective:    Patient ID: Christopher Howard, male    DOB: [birth year], 48 y.o.   MRN: [id number]  HPI 48 yr old male for a cpx. He feels a little tired and sluggish and he thinks his thyroid dose may be too low. His TSH returned in the target range. He has put on some weight in the past year.   Review of Systems  Constitutional: Negative.   HENT: Negative.   Eyes: Negative.   Respiratory: Negative.   Cardiovascular: Negative.   Gastrointestinal: Negative.   Genitourinary: Negative.   Musculoskeletal: Negative.   Skin: Negative.   Neurological: Negative.   Psychiatric/Behavioral: Negative.        Objective:   Physical Exam  Constitutional: He is oriented to person, place, and time. He appears well-developed and well-nourished. No distress.  HENT:  Head: Normocephalic and atraumatic.  Right Ear: External ear normal.  Left Ear: External ear normal.  Nose: Nose normal.  Mouth/Throat: Oropharynx is clear and moist. No oropharyngeal exudate.  Eyes: Conjunctivae and EOM are normal. Pupils are equal, round, and reactive to light. Right eye exhibits no discharge. Left eye exhibits no discharge. No scleral icterus.  Neck: Neck supple. No JVD present. No tracheal deviation present. No thyromegaly present.  Cardiovascular: Normal rate, regular rhythm, normal heart sounds and intact distal pulses.  Exam reveals no gallop and no friction rub.   No murmur heard. EKG normal   Pulmonary/Chest: Effort normal and breath sounds normal. No respiratory distress. He has no wheezes. He has no rales. He exhibits no tenderness.  Abdominal: Soft. Bowel sounds are normal. He exhibits no distension and no mass. There is no tenderness. There is no rebound and no guarding.  Genitourinary: Rectum normal, prostate normal and penis normal. Guaiac negative stool. No penile tenderness.  Musculoskeletal: Normal range of motion. He exhibits no edema and no tenderness.  Lymphadenopathy:    He has no cervical  adenopathy.  Neurological: He is alert and oriented to person, place, and time. He has normal reflexes. No cranial nerve deficit. He exhibits normal muscle tone. Coordination normal.  Skin: Skin is warm and dry. No rash noted. He is not diaphoretic. No erythema. No pallor.  Psychiatric: He has a normal mood and affect. His behavior is normal. Judgment and thought content normal.          Assessment & Plan:  Well exam. Increase Synthroid to 100 mcg daily and recheck in 60 days

## 2013-12-19 DIAGNOSIS — J189 Pneumonia, unspecified organism: Secondary | ICD-10-CM

## 2013-12-19 HISTORY — DX: Pneumonia, unspecified organism: J18.9

## 2013-12-28 ENCOUNTER — Emergency Department (INDEPENDENT_AMBULATORY_CARE_PROVIDER_SITE_OTHER): Payer: 59

## 2013-12-28 ENCOUNTER — Emergency Department (HOSPITAL_COMMUNITY)
Admission: EM | Admit: 2013-12-28 | Discharge: 2013-12-28 | Disposition: A | Discharge status: Home / Self Care | Payer: 59 | Attending: Emergency Medicine | Admitting: Emergency Medicine

## 2013-12-28 ENCOUNTER — Encounter (HOSPITAL_COMMUNITY): Payer: Self-pay | Admitting: Emergency Medicine

## 2013-12-28 ENCOUNTER — Emergency Department (HOSPITAL_COMMUNITY): Payer: 59

## 2013-12-28 ENCOUNTER — Emergency Department (INDEPENDENT_AMBULATORY_CARE_PROVIDER_SITE_OTHER)
Admission: EM | Admit: 2013-12-28 | Discharge: 2013-12-28 | Disposition: A | Discharge status: Nursing Home (Includes ICF) | Payer: 59 | Source: Home / Self Care | Attending: Emergency Medicine | Admitting: Emergency Medicine

## 2013-12-28 DIAGNOSIS — R0989 Other specified symptoms and signs involving the circulatory and respiratory systems: Secondary | ICD-10-CM

## 2013-12-28 DIAGNOSIS — R0602 Shortness of breath: Secondary | ICD-10-CM

## 2013-12-28 DIAGNOSIS — Z87891 Personal history of nicotine dependence: Secondary | ICD-10-CM | POA: Insufficient documentation

## 2013-12-28 DIAGNOSIS — R0609 Other forms of dyspnea: Secondary | ICD-10-CM

## 2013-12-28 DIAGNOSIS — Z8719 Personal history of other diseases of the digestive system: Secondary | ICD-10-CM | POA: Insufficient documentation

## 2013-12-28 DIAGNOSIS — R0600 Dyspnea, unspecified: Secondary | ICD-10-CM

## 2013-12-28 DIAGNOSIS — E039 Hypothyroidism, unspecified: Secondary | ICD-10-CM | POA: Insufficient documentation

## 2013-12-28 DIAGNOSIS — J181 Lobar pneumonia, unspecified organism: Secondary | ICD-10-CM

## 2013-12-28 DIAGNOSIS — J189 Pneumonia, unspecified organism: Secondary | ICD-10-CM | POA: Insufficient documentation

## 2013-12-28 DIAGNOSIS — Z79899 Other long term (current) drug therapy: Secondary | ICD-10-CM | POA: Insufficient documentation

## 2013-12-28 LAB — CBC
HCT: 36.7 % — ABNORMAL LOW (ref 39.0–52.0)
Hemoglobin: 12.4 g/dL — ABNORMAL LOW (ref 13.0–17.0)
MCH: 28.6 pg (ref 26.0–34.0)
MCHC: 33.8 g/dL (ref 30.0–36.0)
MCV: 84.6 fL (ref 78.0–100.0)
Platelets: 135 10*3/uL — ABNORMAL LOW (ref 150–400)
RBC: 4.34 MIL/uL (ref 4.22–5.81)
RDW: 14 % (ref 11.5–15.5)
WBC: 8.6 10*3/uL (ref 4.0–10.5)

## 2013-12-28 LAB — POCT I-STAT, CHEM 8
BUN: 20 mg/dL (ref 6–23)
CALCIUM ION: 1.09 mmol/L — AB (ref 1.12–1.23)
Chloride: 99 mEq/L (ref 96–112)
Creatinine, Ser: 1.7 mg/dL — ABNORMAL HIGH (ref 0.50–1.35)
GLUCOSE: 143 mg/dL — AB (ref 70–99)
HEMATOCRIT: 45 % (ref 39.0–52.0)
HEMOGLOBIN: 15.3 g/dL (ref 13.0–17.0)
POTASSIUM: 3.7 meq/L (ref 3.7–5.3)
Sodium: 134 mEq/L — ABNORMAL LOW (ref 137–147)
TCO2: 24 mmol/L (ref 0–100)

## 2013-12-28 LAB — HEMOGLOBIN AND HEMATOCRIT, BLOOD
HCT: 36.5 % — ABNORMAL LOW (ref 39.0–52.0)
Hemoglobin: 12.2 g/dL — ABNORMAL LOW (ref 13.0–17.0)

## 2013-12-28 LAB — D-DIMER, QUANTITATIVE: D-Dimer, Quant: 0.66 ug/mL-FEU — ABNORMAL HIGH (ref 0.00–0.48)

## 2013-12-28 LAB — TROPONIN I: Troponin I: <0.3 ng/mL (ref <0.30)

## 2013-12-28 MED ORDER — AZITHROMYCIN 250 MG PO TABS: 250.0000 mg | ORAL_TABLET | Freq: Every day | ORAL | Status: DC

## 2013-12-28 MED ORDER — GUAIFENESIN 100 MG/5ML PO LIQD: 100.0000 mg | ORAL | Status: DC | PRN

## 2013-12-28 MED ORDER — IOHEXOL 350 MG/ML SOLN
75.0000 mL | Freq: Once | INTRAVENOUS | Status: AC | PRN
  Administered 2013-12-28: 75 mL via INTRAVENOUS

## 2013-12-28 MED ORDER — IPRATROPIUM-ALBUTEROL 0.5-2.5 (3) MG/3ML IN SOLN
RESPIRATORY_TRACT | Status: AC
  Filled 2013-12-28: qty 3

## 2013-12-28 MED ORDER — PREDNISONE 20 MG PO TABS
60.0000 mg | ORAL_TABLET | Freq: Once | ORAL | Status: AC
  Administered 2013-12-28: 60 mg via ORAL

## 2013-12-28 MED ORDER — ALBUTEROL SULFATE (2.5 MG/3ML) 0.083% IN NEBU
INHALATION_SOLUTION | RESPIRATORY_TRACT | Status: AC
  Filled 2013-12-28: qty 3

## 2013-12-28 MED ORDER — ALBUTEROL SULFATE (2.5 MG/3ML) 0.083% IN NEBU
5.0000 mg | INHALATION_SOLUTION | Freq: Once | RESPIRATORY_TRACT | Status: AC
  Administered 2013-12-28: 5 mg via RESPIRATORY_TRACT

## 2013-12-28 MED ORDER — PREDNISONE 20 MG PO TABS
ORAL_TABLET | ORAL | Status: AC
  Filled 2013-12-28: qty 3

## 2013-12-28 MED ORDER — IPRATROPIUM-ALBUTEROL 0.5-2.5 (3) MG/3ML IN SOLN
3.0000 mL | Freq: Once | RESPIRATORY_TRACT | Status: AC
  Administered 2013-12-28: 3 mL via RESPIRATORY_TRACT

## 2013-12-28 MED ORDER — SODIUM CHLORIDE 0.9 % IV SOLN
INTRAVENOUS | Status: DC
  Administered 2013-12-28: 11:00:00 via INTRAVENOUS

## 2013-12-28 NOTE — ED Notes (Signed)
Report given to [facility] RN.  

## 2013-12-28 NOTE — ED Provider Notes (Signed)
Chief Complaint   Chief Complaint  Patient presents with  . 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 He has allergies to multiple allergens and he thought this might be allergy, so he tried some over-the-counter allergy meds without relief. Right now is very short of breath with tightness in his chest and bilateral chest pain. He feels chilled but not had any fever. He has not been able to bring up any sputum. He has had no hemoptysis. He has no history of asthma. No history of cardiac disease. He denies any leg pain or swelling. No history of blood clots, DVT, or pulmonary embolism. He denies nausea or diaphoresis.  Review of Systems   Other than as noted above, the patient denies any of the following symptoms. Systemic:  No fever. ENT:  No nasal congestion, rhinorrhea, or sore throat. Pulmonary:  No wheezing, sputum production, hemoptysis. Cardiac:  No palpitations, rapid heartbeat, dizziness, presyncope or syncope. Skin:  No rash or itching. Ext:  No leg pain or swelling. Psych:  No anxiety or depression.  [place]   Past medical history, family history, social history, meds, and allergies were reviewed. He has high blood pressure, hyperlipidemia, GERD, and hypothyroidism. Current medications include Synthroid and lisinopril/hydrochlorothiazide. He is allergic to ketoconazole.  Physical Examination    Vital signs:  BP 132/85  Pulse 100  Temp(Src) 99.2 F (37.3 C) (Oral)  Resp 26  SpO2 98% Gen:  Alert, oriented, appears in moderate respiratory distress with rapid, labored breathing, no use of accessory musculature or retractions, skin warm and dry. Eye:  PERRL, lids and conjunctivas normal.  Sclera non-icteric. ENT:  Mucous membranes moist, pharynx clear. Neck:   Supple, no adenopathy or tenderness.  No JVD. Lungs:  Clear to auscultation and percussion, no wheezes, rales or rhonchi. His lungs sound completely clear both before and after breathing treatment.  Heart:  Regular rhythm.  No gallops, murmers, clicks or rubs. Abdomen:  Soft, nontender, no organomegaly or mass.  Bowel sounds normal.  No pulsatile abdominal mass or bruit. Ext:  No edema.  No calf tenderness and Homann's sign negative.  Pulses full and equal. Skin:  Warm and dry.  No rash.  Labs   Results for orders placed during the hospital encounter of 12/28/13  D-DIMER, QUANTITATIVE      Result Value Ref Range   D-Dimer, Quant 0.66 (*) 0.00 - 0.48 ug/mL-FEU  POCT I-STAT, CHEM 8      Result Value Ref Range   Sodium 134 (*) 137 - 147 mEq/L   Potassium 3.7  3.7 - 5.3 mEq/L   Chloride 99  96 - 112 mEq/L   BUN 20  6 - 23 mg/dL   Creatinine, Ser 1.70 (*) 0.50 - 1.35 mg/dL   Glucose, Bld 143 (*) 70 - 99 mg/dL   Calcium, Ion 1.09 (*) 1.12 - 1.23 mmol/L   TCO2 24  0 - 100 mmol/L   Hemoglobin 15.3  13.0 - 17.0 g/dL   HCT 45.0  39.0 - 52.0 %     Radiology   Dg Chest 2 View  12/28/2013   CLINICAL DATA:  Shortness of breath, chest pain  EXAM: CHEST - 2 VIEW  COMPARISON:  None available  FINDINGS: Patchy subsegmental atelectasis or infiltrate in the  infrahilar regions right worse than left. Heart size normal. . No effusion. Visualized skeletal structures are unremarkable.  IMPRESSION: Asymmetric infrahilar infiltrate or atelectasis, right worse than left.   Electronically Signed   By: Arne Cleveland M.D.   On: 12/28/2013 10:08   EKG Results:  Date: 12/28/2013  Rate: 96  Rhythm: normal sinus rhythm  QRS Axis: normal  Intervals: normal  ST/T Wave abnormalities: normal  Conduction Disutrbances:none  Narrative Interpretation: Normal sinus rhythm, normal EKG.  Old EKG Reviewed: none available   Course in Urgent [place]   He was given a DuoNeb breathing treatment and prednisone 60 mg  by mouth. Thereafter he states he felt a little bit better, but still appeared markedly short of breath. His lungs sounded completely clear both before and after the breathing treatment.  He was begun on oxygen and IV normal saline at 50 mL per hour in preparation for his transfer to the hospital.  Assessment   The encounter diagnosis was Dyspnea.  With infiltrates on his chest x-ray and a positive d-dimer, he has a good possibility of pulmonary embolism.  Plan    The patient was transferred to the ED via  [place] in stable condition.  Medical Decision Making:  48 year old male had sudden onset of shortness of breath and right and left chest pain Thursday after mowing lawn.  He has cough but denies any wheezing or history of asthma.  Upon arrival he is short of breath but lungs sound clear with no wheezes.  O2 sat is 98%.  He feels a little better after a DuoNeb breathing treatment and PO Prednisone 60 mg, but he is still short of breath with clear lungs.  CXR shows bilateral infiltrates vs atelectasis.  D-dimer was elevated at 0.66.  His EKG was normal.  He is being transported for poss PE.       Harden Mo, MD 12/28/13 1030

## 2013-12-28 NOTE — Discharge Instructions (Signed)
We have determined that your problem requires further evaluation in the emergency department.  We will take care of your transport there.  Once at the emergency department, you will be evaluated by a provider and they will order whatever treatment or tests they deem necessary.  We cannot guarantee that they will do any specific test or do any specific treatment.  ° °

## 2013-12-28 NOTE — ED Notes (Signed)
Patient complains of cough shortness of breath with cough after mowing lawn on Thursday.

## 2013-12-28 NOTE — ED Notes (Signed)
Pt stats remained between 93%-94%

## 2013-12-28 NOTE — ED Notes (Signed)
Attempted one unsucessful IV on right wrist.

## 2013-12-28 NOTE — ED Notes (Signed)
TO ED via [facility] from [facility] with c/o shortness of breath that started Friday after working in yard. Worsened yesterday--took nyquil and other cold meds, felt better and slept last night. Woke up this morning with further shortness of breath. Received Prednisone 60mg  po and Neb treatment at Urgent Care. 20 G IV in RAC

## 2013-12-28 NOTE — ED Provider Notes (Signed)
CSN: [id number]     Arrival date & time 12/28/13  1059 History   First MD Initiated Contact with Patient 12/28/13 1155     Chief Complaint  Patient presents with  . Shortness of Breath     (Consider location/radiation/quality/duration/timing/severity/associated sxs/prior Treatment) HPI Pt is a 48 year old male sent to ED from urgent care for further evaluation of shortness of breath, cough and chest pain concern for PE as patient had elevated d-dimer.  Medical records from UC reviewed. Pt reports inhaling some dirt 4 days ago when his son was mowing the lawn and believes his symptoms may be a result of that.  Chest pain is bilateral, aching, worse with cough.  Pt has tried OTC allergy medication thinking symptoms were due to mowing the lawn, however, denies improvement.  Reports hot and cold chills w/o fever.  Denies chest congestion.  Denies hemoptysis. Denies hx of asthma, COPD, CAD, or blood clots. Denies leg pain or swelling. 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    Past Medical History  Diagnosis Date  . GERD (gastroesophageal reflux disease)   . Seasonal allergies   . Hyperlipidemia   . 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 Hypothyroidism (acquired)     sees Dr. Loanne Drilling    Past Surgical History  Procedure Laterality Date  . Thyroidectomy    . Wisdom tooth extraction     Family History  Problem Relation Age of Onset  . Alcohol abuse Other   . Arthritis Other   . Cancer Other     colon  . Diabetes Other   . Kidney disease Other   . Stroke Other   . Heart disease Other   . Heart disease Mother   . Heart attack Father    History  Substance Use Topics  . Smoking status: Former Smoker -- 20 years    Types: Cigarettes  . Smokeless tobacco: Never Used     Comment: e-cigarettes  . Alcohol Use: No    Review of Systems  Constitutional: Positive for chills. Negative for fever.  HENT:  Negative for congestion.   Respiratory: Positive for cough, chest tightness and shortness of breath. Negative for wheezing and stridor.   Cardiovascular: Positive for chest pain ( bilateral chest wall). Negative for palpitations.  Gastrointestinal: Negative for nausea, vomiting, abdominal pain and diarrhea.  All other systems reviewed and are negative.     Allergies  Ketoconazole  Home Medications   Prior to Admission medications   Medication Sig Start Date End Date Taking? Authorizing Provider  Dextromethorphan-Guaifenesin (MUCINEX DM PO) Take 1 tablet by mouth daily as needed (allergies).   Yes Historical Provider, MD  Doxylamine-Phenylephrine-APAP (VICKS SINEX DAYQUIL/NYQUIL PO) Take 30 mLs by mouth 2 (two) times daily as needed (cold symptoms).   Yes Historical Provider, MD  levothyroxine (SYNTHROID, LEVOTHROID) 100 MCG tablet Take 1 tablet (100 mcg total) by mouth daily. 12/08/13  Yes Laurey Morale, MD  lisinopril-hydrochlorothiazide (PRINZIDE,ZESTORETIC) 10-12.5 MG per tablet Take 1 tablet by mouth daily.   Yes Historical Provider, MD   BP 97/44  Pulse 86  Temp(Src) 100 F (37.8 C) (Oral)  Resp 15  SpO2 98% Physical Exam  Nursing note and vitals reviewed. Constitutional: He appears well-developed and well-nourished. No distress.  Appears well, non-toxic, NAD  HENT:  Head: Normocephalic and atraumatic.  Eyes: Conjunctivae are normal. No scleral icterus.  Neck: Normal range  of motion.  Cardiovascular: Normal rate, regular rhythm and normal heart sounds.   Pulmonary/Chest: Effort normal and breath sounds normal. No respiratory distress. He has no wheezes. He has no rales. He exhibits no tenderness.  No respiratory distress, nasal canula in place. Able to speak in full sentences. Lungs: CTAB. No reproducible chest wall tenderness  Abdominal: Soft. Bowel sounds are normal. He exhibits no distension and no mass. There is no tenderness. There is no rebound and no guarding.   Musculoskeletal: Normal range of motion.  No lower leg edema, tenderness or erythema.  Neurological: He is alert.  Skin: Skin is warm and dry. He is not diaphoretic. No erythema.    ED Course  Procedures (including critical care time) Labs Review Labs Reviewed  CBC - Abnormal; Notable for the following:    Hemoglobin 12.4 (*)    HCT 36.7 (*)    Platelets 135 (*)    All other components within normal limits  HEMOGLOBIN AND HEMATOCRIT, BLOOD - Abnormal; Notable for the following:    Hemoglobin 12.2 (*)    HCT 36.5 (*)    All other components within normal limits  TROPONIN I    Imaging Review Dg Chest 2 View  12/28/2013   CLINICAL DATA:  Shortness of breath, chest pain  EXAM: CHEST - 2 VIEW  COMPARISON:  None available  FINDINGS: Patchy subsegmental atelectasis or infiltrate in the infrahilar regions right worse than left. Heart size normal. . No effusion. Visualized skeletal structures are unremarkable.  IMPRESSION: Asymmetric infrahilar infiltrate or atelectasis, right worse than left.   Electronically Signed   By: Arne Cleveland M.D.   On: 12/28/2013 10:08     EKG Interpretation None      MDM   Final diagnoses:  Right middle lobe pneumonia  Shortness of breath   Pt sent to ED from UC for further evaluation of possible PE. Medical records from UC reviewed. Pt had elevated d-dimer at [facility].  CT angio chest: right middle lobe pneumonia, negative for acute PE or thoracic aortic dissection.  CBC-noted drop in Hgb and Hct from initial istat chem-8 at urgent care. H&H repeated in ED, consistent with CBC in ED.  i-stat chem 8 from UC likely falsely elevated Hgb and Hct.    Discussed pt with Dr. Tawnya Crook, will discharge pt home and tx for CAP.  Rx: azithromycin. Advised to f/u with PCP.  Return precautions provided. Pt verbalized understanding and agreement with tx plan.       Noland Fordyce, PA-C 12/28/13 1605

## 2013-12-29 NOTE — ED Provider Notes (Signed)
Medical screening examination/treatment/procedure(s) were performed by non-physician practitioner and as supervising physician I was immediately available for consultation/collaboration.   EKG Interpretation None        Neta Ehlers, MD 12/29/13 1700

## 2013-12-30 ENCOUNTER — Ambulatory Visit (INDEPENDENT_AMBULATORY_CARE_PROVIDER_SITE_OTHER): Payer: 59 | Admitting: Family Medicine

## 2013-12-30 ENCOUNTER — Ambulatory Visit (INDEPENDENT_AMBULATORY_CARE_PROVIDER_SITE_OTHER)
Admission: RE | Admit: 2013-12-30 | Discharge: 2013-12-30 | Disposition: A | Discharge status: Home / Self Care | Payer: 59 | Source: Ambulatory Visit | Attending: Family Medicine | Admitting: Family Medicine

## 2013-12-30 ENCOUNTER — Encounter: Payer: Self-pay | Admitting: Family Medicine

## 2013-12-30 VITALS — BP 102/74 | HR 96 | Temp 101.0°F | Ht 69.0 in | Wt 240.0 lb

## 2013-12-30 DIAGNOSIS — J189 Pneumonia, unspecified organism: Secondary | ICD-10-CM

## 2013-12-30 DIAGNOSIS — J181 Lobar pneumonia, unspecified organism: Principal | ICD-10-CM

## 2013-12-30 MED ORDER — CEFTRIAXONE SODIUM 1 G IJ SOLR: 1.0000 g | INTRAMUSCULAR | Status: DC

## 2013-12-30 MED ORDER — CEFTRIAXONE SODIUM 1 G IJ SOLR
1.0000 g | Freq: Once | INTRAMUSCULAR | Status: AC
  Administered 2013-12-30: 1 g via INTRAMUSCULAR

## 2013-12-30 MED ORDER — LEVOFLOXACIN 500 MG PO TABS: 500.0000 mg | ORAL_TABLET | Freq: Every day | ORAL | Status: AC

## 2013-12-30 NOTE — Progress Notes (Signed)
Pre visit review using our clinic review tool, if applicable. No additional management support is needed unless otherwise documented below in the visit note. 

## 2013-12-30 NOTE — Progress Notes (Signed)
   Subjective:    Patient ID: Christopher Howard, male    DOB: [birth year], 48 y.o.   MRN: [id number]  HPI Here to follow up pneumonia. He felt ill about 5 days ago with fever, cough, chest pains and SOB. He was seen at the ER on 12-28-13 and a CXR revealed RML infiltrates. A chest CT angiogram confirmed a RML pneumonia and ruled out any PEs. He was given a Zpack, of which he has taken 3 days worth. Today he actually feels worse than ever, with a dry cough and more SOB.    Review of Systems  Constitutional: Positive for fever.  HENT: Negative.   Eyes: Negative.   Respiratory: Positive for cough, chest tightness and shortness of breath. Negative for wheezing.   Cardiovascular: Negative.        Objective:   Physical Exam  Constitutional: He appears well-developed and well-nourished.  In mild distress, clearly SOB  Cardiovascular: Normal rate, regular rhythm, normal heart sounds and intact distal pulses.   Pulmonary/Chest: Effort normal and breath sounds normal. He has no wheezes. He has no rales.          Assessment & Plan:  Partially treated pneumonia. He will finish out the [place]. Start on Levaquin 500 mg daily for 10 days. Given a shot of Rocephin. Recheck here in 3 days. Written out of work today until 01-05-14.

## 2013-12-30 NOTE — Addendum Note (Signed)
Addended by: [place] Roger D on: 12/30/2013 05:16 PM   Modules accepted: Orders

## 2014-01-02 ENCOUNTER — Encounter: Payer: Self-pay | Admitting: Family Medicine

## 2014-01-02 ENCOUNTER — Ambulatory Visit (INDEPENDENT_AMBULATORY_CARE_PROVIDER_SITE_OTHER): Payer: 59 | Admitting: Family Medicine

## 2014-01-02 VITALS — BP 123/93 | HR 74 | Temp 98.3°F | Ht 69.0 in | Wt 238.0 lb

## 2014-01-02 DIAGNOSIS — J181 Lobar pneumonia, unspecified organism: Principal | ICD-10-CM

## 2014-01-02 DIAGNOSIS — J189 Pneumonia, unspecified organism: Secondary | ICD-10-CM

## 2014-01-02 NOTE — Progress Notes (Signed)
Pre visit review using our clinic review tool, if applicable. No additional management support is needed unless otherwise documented below in the visit note. 

## 2014-01-02 NOTE — Progress Notes (Signed)
   Subjective:    Patient ID: Christopher Howard, male    DOB: 08/29/1965, 48 y.o.   MRN: [id number]  HPI Here to follow up on RML pneumonia. He feels better now with less coughing and more energy. No more fevers. He is halfway through a 10 day course of Levaquin.    Review of Systems  Constitutional: Negative.   Respiratory: Positive for cough and shortness of breath. Negative for chest tightness and wheezing.   Cardiovascular: Negative.        Objective:   Physical Exam  Constitutional: He appears well-developed and well-nourished.  He looks much better   Cardiovascular: Normal rate, regular rhythm, normal heart sounds and intact distal pulses.   Pulmonary/Chest: Effort normal. No respiratory distress. He has no wheezes. He has no rales. He exhibits no tenderness.  Slight RML crackles           Assessment & Plan:  Partially treated pneumonia. He will finish out the antibiotic. Repeat CXR about 3 weeks from now. He will return to work on 02-06-14.

## 2014-01-08 ENCOUNTER — Encounter: Payer: Self-pay | Admitting: Family Medicine

## 2014-03-04 ENCOUNTER — Ambulatory Visit (INDEPENDENT_AMBULATORY_CARE_PROVIDER_SITE_OTHER)
Admission: RE | Admit: 2014-03-04 | Discharge: 2014-03-04 | Disposition: A | Discharge status: Home / Self Care | Payer: 59 | Source: Ambulatory Visit | Attending: Family Medicine | Admitting: Family Medicine

## 2014-03-04 DIAGNOSIS — J181 Lobar pneumonia, unspecified organism: Principal | ICD-10-CM

## 2014-03-04 DIAGNOSIS — J189 Pneumonia, unspecified organism: Secondary | ICD-10-CM

## 2014-04-20 ENCOUNTER — Encounter: Payer: Self-pay | Admitting: Gastroenterology

## 2014-06-05 ENCOUNTER — Ambulatory Visit (AMBULATORY_SURGERY_CENTER): Payer: Self-pay | Admitting: *Deleted

## 2014-06-05 VITALS — Ht 70.0 in | Wt 246.0 lb

## 2014-06-05 DIAGNOSIS — Z1211 Encounter for screening for malignant neoplasm of colon: Secondary | ICD-10-CM

## 2014-06-05 MED ORDER — MOVIPREP 100 G PO SOLR: 1.0000 | Freq: Once | ORAL | Status: DC

## 2014-06-05 NOTE — Progress Notes (Signed)
Denies allergies to eggs or soy products. Denies complications with sedation or anesthesia. Denies O2 use. Denies use of diet or weight loss medications.  Emmi instructions given for colonoscopy.  

## 2014-06-26 ENCOUNTER — Encounter: Payer: Self-pay | Admitting: Gastroenterology

## 2014-06-26 ENCOUNTER — Ambulatory Visit (AMBULATORY_SURGERY_CENTER): Payer: 59 | Admitting: Gastroenterology

## 2014-06-26 VITALS — BP 120/70 | HR 50 | Temp 97.4°F | Resp 17 | Wt 246.0 lb

## 2014-06-26 DIAGNOSIS — D125 Benign neoplasm of sigmoid colon: Secondary | ICD-10-CM

## 2014-06-26 DIAGNOSIS — D123 Benign neoplasm of transverse colon: Secondary | ICD-10-CM

## 2014-06-26 DIAGNOSIS — Z1211 Encounter for screening for malignant neoplasm of colon: Secondary | ICD-10-CM

## 2014-06-26 DIAGNOSIS — K635 Polyp of colon: Secondary | ICD-10-CM

## 2014-06-26 HISTORY — PX: COLONOSCOPY: SHX174

## 2014-06-26 MED ORDER — SODIUM CHLORIDE 0.9 % IV SOLN: 500.0000 mL | INTRAVENOUS | Status: DC

## 2014-06-26 NOTE — Progress Notes (Signed)
Report to PACU, RN, vss, BBS= Clear.  

## 2014-06-26 NOTE — Progress Notes (Signed)
Called to room to assist during endoscopic procedure.  Patient ID and intended procedure confirmed with present staff. Received instructions for my participation in the procedure from the performing physician.  

## 2014-06-26 NOTE — Op Note (Signed)
[place]  [address] [place], [address]   COLONOSCOPY PROCEDURE REPORT  PATIENT: Christopher Howard, Christopher Howard  MR#: [id number] BIRTHDATE: 1966/01/18 , 48  yrs. old GENDER: male ENDOSCOPIST: Ladene Artist, MD, [facility] REFERRED BY:Fry, Ishmael Holter. PROCEDURE DATE:  06/26/2014 PROCEDURE:   Colonoscopy with snare polypectomy First Screening Colonoscopy - Avg.  risk and is 50 yrs.  old or older Yes.  Prior Negative Screening - Now for repeat screening. N/A  History of Adenoma - Now for follow-up colonoscopy & has been > or = to 3 yrs.  N/A  Polyps Removed Today? Yes. ASA CLASS:   Class II INDICATIONS:average risk for colorectal cancer. MEDICATIONS: Monitored anesthesia care and Propofol 240 mg IV DESCRIPTION OF PROCEDURE:   After the risks benefits and alternatives of the procedure were thoroughly explained, informed consent was obtained.  The digital rectal exam revealed no abnormalities of the rectum.   The LB [id number]  endoscope was introduced through the anus and advanced to the cecum, which was identified by both the appendix and ileocecal valve. No adverse events experienced.   The quality of the prep was excellent, using MoviPrep  The instrument was then slowly withdrawn as the colon was fully examined.  COLON FINDINGS: A sessile polyp measuring 5 mm in size was found in the transverse colon.  A polypectomy was performed with a cold snare.  The resection was complete, the polyp tissue was completely retrieved and sent to histology.   Four sessile polyps measuring 4-6 mm in size were found. in the sigmoid colon.  A polypectomy was performed with a cold snare.  The resection was complete, the polyp tissue was completely retrieved and sent to histology.   The examination was otherwise normal.  Retroflexed views revealed internal Grade I hemorrhoids. The time to cecum=1 minutes 22 seconds.  Withdrawal time=13 minutes 59 seconds.  The scope was withdrawn and the  procedure completed.  COMPLICATIONS: There were no immediate complications.  ENDOSCOPIC IMPRESSION: 1.   Sessile polyp in the transverse colon; polypectomy performed with a cold snare 2.   Four sessile polyps in the sigmoid colon; polypectomy performed with a cold snare 3.   Grade I internal hemorrhoids  RECOMMENDATIONS: 1.  Await biopsy results 2.  Repeat colonoscopy in 5 years if polyp(s0 adenomatous; otherwise 10 years  eSigned:  Ladene Artist, MD, [facility] 06/26/2014 10:49 AM

## 2014-06-26 NOTE — Patient Instructions (Signed)
Impressions/recommendations:  Polyps (handout given) Hemorrhoids (handout given)  Repeat colonoscopy pending pathology results.  YOU HAD AN ENDOSCOPIC PROCEDURE TODAY AT THE [facility]: Refer to the procedure report that was given to you for any specific questions about what was found during the examination.  If the procedure report does not answer your questions, please call your gastroenterologist to clarify.  If you requested that your care partner not be given the details of your procedure findings, then the procedure report has been included in a sealed envelope for you to review at your convenience later.  YOU SHOULD EXPECT: Some feelings of bloating in the abdomen. Passage of more gas than usual.  Walking can help get rid of the air that was put into your GI tract during the procedure and reduce the bloating. If you had a lower endoscopy (such as a colonoscopy or flexible sigmoidoscopy) you may notice spotting of blood in your stool or on the toilet paper. If you underwent a bowel prep for your procedure, then you may not have a normal bowel movement for a few days.  DIET: Your first meal following the procedure should be a light meal and then it is ok to progress to your normal diet.  A half-sandwich or bowl of soup is an example of a good first meal.  Heavy or fried foods are harder to digest and may make you feel nauseous or bloated.  Likewise meals heavy in dairy and vegetables can cause extra gas to form and this can also increase the bloating.  Drink plenty of fluids but you should avoid alcoholic beverages for 24 hours.  ACTIVITY: Your care partner should take you home directly after the procedure.  You should plan to take it easy, moving slowly for the rest of the day.  You can resume normal activity the day after the procedure however you should NOT DRIVE or use heavy machinery for 24 hours (because of the sedation medicines used during the test).    SYMPTOMS TO REPORT  IMMEDIATELY: A gastroenterologist can be reached at any hour.  During normal business hours, 8:30 AM to 5:00 PM Monday through Friday, call [phone].  After hours and on weekends, please call the GI answering service at [phone] who will take a message and have the physician on call contact you.   Following lower endoscopy (colonoscopy or flexible sigmoidoscopy):  Excessive amounts of blood in the stool  Significant tenderness or worsening of abdominal pains  Swelling of the abdomen that is new, acute  Fever of 100F or higher   FOLLOW UP: If any biopsies were taken you will be contacted by phone or by letter within the next 1-3 weeks.  Call your gastroenterologist if you have not heard about the biopsies in 3 weeks.  Our staff will call the home number listed on your records the next business day following your procedure to check on you and address any questions or concerns that you may have at that time regarding the information given to you following your procedure. This is a courtesy call and so if there is no answer at the home number and we have not heard from you through the emergency physician on call, we will assume that you have returned to your regular daily activities without incident.  SIGNATURES/CONFIDENTIALITY: You and/or your care partner have signed paperwork which will be entered into your electronic medical record.  These signatures attest to the fact that that the information above on your After   Visit Summary has been reviewed and is understood.  Full responsibility of the confidentiality of this discharge information lies with you and/or your care-partner. 

## 2014-06-29 ENCOUNTER — Telehealth: Payer: Self-pay | Admitting: *Deleted

## 2014-06-29 NOTE — Telephone Encounter (Signed)
No answer, message left for the patient. 

## 2014-07-01 ENCOUNTER — Encounter: Payer: Self-pay | Admitting: Gastroenterology

## 2014-07-07 ENCOUNTER — Encounter: Payer: Self-pay | Admitting: Family Medicine

## 2014-07-07 ENCOUNTER — Ambulatory Visit (INDEPENDENT_AMBULATORY_CARE_PROVIDER_SITE_OTHER): Payer: 59 | Admitting: Family Medicine

## 2014-07-07 VITALS — BP 130/100 | Temp 98.4°F | Wt 247.0 lb

## 2014-07-07 DIAGNOSIS — I10 Essential (primary) hypertension: Secondary | ICD-10-CM

## 2014-07-07 NOTE — Patient Instructions (Signed)
Plain antihistamine............. No D  Zestoretic 10-12 0.5......Marland Kitchen 1 daily in the morning  Salt free diet  Walk 30 minutes daily  BP check every morning........... Follow-up with Dr. Sarajane Jews in 2 weeks.......... Bring a record of all your blood pressure readings with you

## 2014-07-07 NOTE — Progress Notes (Signed)
   Subjective:    Patient ID: Christopher Howard, male    DOB: [birth year], 48 y.o.   MRN: [id number]  HPI Christopher Howard is a 48 year old male nonsmoker who comes in today for evaluation of hypertension He's been on Zestoretic 1012 0.5 daily and has not skipped any medication. He had a dental procedure yesterday his blood pressure was elevated therefore he called and came in today  Had a colonoscopy last week and his blood pressure was normal. He's also taken an antihistamine and he thinks it has a decongestant in it....... But is not sure   Review of Systems    review of systems otherwise negative Objective:   Physical Exam  Well-developed well-nourished male no acute distress vital signs stable is afebrile BP today 130/100      Assessment & Plan:  Hypertension question ago,,,,,,,,,,,,'s stop the antihistamine with the decongestant,,,,,,,,,, plain antihistamine,,,,,,,, BP check every morning,,,,,, return to see Dr. Sarajane Howard in 2 weeks with a record of all your blood pressure readings

## 2014-07-07 NOTE — Progress Notes (Signed)
Pre visit review using our clinic review tool, if applicable. No additional management support is needed unless otherwise documented below in the visit note. 

## 2014-07-09 ENCOUNTER — Telehealth: Payer: Self-pay | Admitting: Family Medicine

## 2014-07-09 NOTE — Telephone Encounter (Signed)
emmi mailed  °

## 2014-07-22 ENCOUNTER — Ambulatory Visit: Payer: 59 | Admitting: Family Medicine

## 2014-09-21 ENCOUNTER — Telehealth: Payer: Self-pay | Admitting: Family Medicine

## 2014-09-21 ENCOUNTER — Other Ambulatory Visit: Payer: Self-pay | Admitting: Family Medicine

## 2014-09-21 DIAGNOSIS — Z0000 Encounter for general adult medical examination without abnormal findings: Secondary | ICD-10-CM

## 2014-09-21 NOTE — Telephone Encounter (Signed)
Patient would like to go to [facility] and have his CPX labs drawn.  He plans on going Wednesday, 09/23/14.

## 2014-09-21 NOTE — Telephone Encounter (Signed)
Per Dr. Sarajane Jews okay to put future lab order in for [facility]. I put orders in computer and spoke with pt.

## 2014-09-23 ENCOUNTER — Other Ambulatory Visit (INDEPENDENT_AMBULATORY_CARE_PROVIDER_SITE_OTHER): Payer: 59

## 2014-09-23 DIAGNOSIS — Z0000 Encounter for general adult medical examination without abnormal findings: Secondary | ICD-10-CM

## 2014-09-23 LAB — URINALYSIS
Bilirubin Urine: NEGATIVE
Ketones, ur: NEGATIVE
Leukocytes, UA: NEGATIVE
Nitrite: NEGATIVE
Specific Gravity, Urine: >=1.03 — AB (ref 1.000–1.030)
Total Protein, Urine: NEGATIVE
Urine Glucose: NEGATIVE
Urobilinogen, UA: 0.2 (ref 0.0–1.0)
pH: 6 (ref 5.0–8.0)

## 2014-09-23 LAB — CBC WITH DIFFERENTIAL/PLATELET
BASOS ABS: 0 10*3/uL (ref 0.0–0.1)
Basophils Relative: 0.4 % (ref 0.0–3.0)
Eosinophils Absolute: 0.2 10*3/uL (ref 0.0–0.7)
Eosinophils Relative: 3.1 % (ref 0.0–5.0)
HCT: 40.7 % (ref 39.0–52.0)
Hemoglobin: 13.7 g/dL (ref 13.0–17.0)
Lymphocytes Relative: 28.1 % (ref 12.0–46.0)
Lymphs Abs: 1.5 10*3/uL (ref 0.7–4.0)
MCHC: 33.7 g/dL (ref 30.0–36.0)
MCV: 83.7 fl (ref 78.0–100.0)
Monocytes Absolute: 0.5 10*3/uL (ref 0.1–1.0)
Monocytes Relative: 9.5 % (ref 3.0–12.0)
NEUTROS ABS: 3.1 10*3/uL (ref 1.4–7.7)
Neutrophils Relative %: 58.9 % (ref 43.0–77.0)
PLATELETS: 181 10*3/uL (ref 150.0–400.0)
RBC: 4.86 Mil/uL (ref 4.22–5.81)
RDW: 15.1 % (ref 11.5–15.5)
WBC: 5.2 10*3/uL (ref 4.0–10.5)

## 2014-09-23 LAB — PSA: PSA: 1.09 ng/mL (ref 0.10–4.00)

## 2014-09-23 LAB — LIPID PANEL
Cholesterol: 201 mg/dL — ABNORMAL HIGH (ref 0–200)
HDL: 48.5 mg/dL (ref >39.00)
LDL Cholesterol: 135 mg/dL — ABNORMAL HIGH (ref 0–99)
NonHDL: 152.5
TRIGLYCERIDES: 88 mg/dL (ref 0.0–149.0)
Total CHOL/HDL Ratio: 4
VLDL: 17.6 mg/dL (ref 0.0–40.0)

## 2014-09-23 LAB — HEPATIC FUNCTION PANEL
ALBUMIN: 4.2 g/dL (ref 3.5–5.2)
ALT: 13 U/L (ref 0–53)
AST: 15 U/L (ref 0–37)
Alkaline Phosphatase: 74 U/L (ref 39–117)
BILIRUBIN DIRECT: 0.1 mg/dL (ref 0.0–0.3)
BILIRUBIN TOTAL: 0.5 mg/dL (ref 0.2–1.2)
TOTAL PROTEIN: 6.9 g/dL (ref 6.0–8.3)

## 2014-09-23 LAB — BASIC METABOLIC PANEL
BUN: 21 mg/dL (ref 6–23)
CHLORIDE: 106 meq/L (ref 96–112)
CO2: 30 mEq/L (ref 19–32)
CREATININE: 1.35 mg/dL (ref 0.40–1.50)
Calcium: 9.2 mg/dL (ref 8.4–10.5)
GFR: 71.65 mL/min (ref >60.00)
Glucose, Bld: 95 mg/dL (ref 70–99)
Potassium: 4.1 mEq/L (ref 3.5–5.1)
Sodium: 140 mEq/L (ref 135–145)

## 2014-09-23 LAB — TSH: TSH: 0.5 u[IU]/mL (ref 0.35–4.50)

## 2014-09-30 ENCOUNTER — Ambulatory Visit (INDEPENDENT_AMBULATORY_CARE_PROVIDER_SITE_OTHER): Payer: 59 | Admitting: Family Medicine

## 2014-09-30 ENCOUNTER — Encounter: Payer: Self-pay | Admitting: Family Medicine

## 2014-09-30 VITALS — BP 122/81 | HR 77 | Temp 97.9°F | Ht 70.0 in | Wt 250.0 lb

## 2014-09-30 DIAGNOSIS — G4733 Obstructive sleep apnea (adult) (pediatric): Secondary | ICD-10-CM

## 2014-09-30 DIAGNOSIS — Z0000 Encounter for general adult medical examination without abnormal findings: Secondary | ICD-10-CM

## 2014-09-30 MED ORDER — LISINOPRIL-HYDROCHLOROTHIAZIDE 10-12.5 MG PO TABS: 1.0000 | ORAL_TABLET | Freq: Every day | ORAL | Status: DC

## 2014-09-30 MED ORDER — PHENTERMINE HCL 37.5 MG PO CAPS: 37.5000 mg | ORAL_CAPSULE | ORAL | Status: DC

## 2014-09-30 MED ORDER — LEVOTHYROXINE SODIUM 100 MCG PO TABS: 100.0000 ug | ORAL_TABLET | Freq: Every day | ORAL | Status: DC

## 2014-09-30 NOTE — Progress Notes (Signed)
Pre visit review using our clinic review tool, if applicable. No additional management support is needed unless otherwise documented below in the visit note. 

## 2014-09-30 NOTE — Progress Notes (Signed)
   Subjective:    Patient ID: Christopher Howard, male    DOB: 27-Dec-1965, 49 y.o.   MRN: [id number]  HPI 49 yr old male for a cpx. He feels tired all the time and he is not sure why. He works out at [employer] 4 days a week and eats a healthy diet. He gets plenty of sleep. He cannot understand why he is not losing weight. Of note he says that his wife tells him his snoring is getting worse all the time, and that in the past 2 months she can hardly stand to stay in the same bedroom as him.    Review of Systems  Constitutional: Positive for fatigue. Negative for activity change and appetite change.  HENT: Negative.   Eyes: Negative.   Respiratory: Negative.   Cardiovascular: Negative.   Gastrointestinal: Negative.   Genitourinary: Negative.   Musculoskeletal: Negative.   Skin: Negative.   Neurological: Negative.   Psychiatric/Behavioral: Negative.        Objective:   Physical Exam  Constitutional: He is oriented to person, place, and time. No distress.  Obese   HENT:  Head: Normocephalic and atraumatic.  Right Ear: External ear normal.  Left Ear: External ear normal.  Nose: Nose normal.  Mouth/Throat: Oropharynx is clear and moist. No oropharyngeal exudate.  Eyes: Conjunctivae and EOM are normal. Pupils are equal, round, and reactive to light. Right eye exhibits no discharge. Left eye exhibits no discharge. No scleral icterus.  Neck: Neck supple. No JVD present. No tracheal deviation present. No thyromegaly present.  Cardiovascular: Normal rate, regular rhythm, normal heart sounds and intact distal pulses.  Exam reveals no gallop and no friction rub.   No murmur heard. Pulmonary/Chest: Effort normal and breath sounds normal. No respiratory distress. He has no wheezes. He has no rales. He exhibits no tenderness.  Abdominal: Soft. Bowel sounds are normal. He exhibits no distension and no mass. There is no tenderness. There is no rebound and no guarding.  Genitourinary: Rectum normal,  prostate normal and penis normal. Guaiac negative stool. No penile tenderness.  Musculoskeletal: Normal range of motion. He exhibits no edema or tenderness.  Lymphadenopathy:    He has no cervical adenopathy.  Neurological: He is alert and oriented to person, place, and time. He has normal reflexes. No cranial nerve deficit. He exhibits normal muscle tone. Coordination normal.  Skin: Skin is warm and dry. No rash noted. He is not diaphoretic. No erythema. No pallor.  Psychiatric: He has a normal mood and affect. His behavior is normal. Judgment and thought content normal.          Assessment & Plan:  Well exam. I think it is very likely that he has sleep apnea, and this has been exacerbated by his recent weight gain. Try Phentermine for 6 months to help lose weight. Refer to Dr. Gwenette Greet for a sleep apnea evaluation.

## 2014-11-16 ENCOUNTER — Ambulatory Visit (INDEPENDENT_AMBULATORY_CARE_PROVIDER_SITE_OTHER): Payer: 59 | Admitting: Pulmonary Disease

## 2014-11-16 ENCOUNTER — Encounter: Payer: Self-pay | Admitting: Pulmonary Disease

## 2014-11-16 VITALS — BP 132/80 | HR 81 | Temp 97.0°F | Ht 70.0 in | Wt 240.4 lb

## 2014-11-16 DIAGNOSIS — G4733 Obstructive sleep apnea (adult) (pediatric): Secondary | ICD-10-CM | POA: Insufficient documentation

## 2014-11-16 NOTE — Assessment & Plan Note (Signed)
The patient's history is very suggestive of clinically significant obstructive sleep apnea. I have had a long discussion with him about the pathophysiology of sleep disordered breathing, including its impact to his quality of life and cardiovascular health. He will need to have a sleep study for diagnosis, and I think he is an excellent candidate for home sleep testing. The patient is agreeable to this approach.

## 2014-11-16 NOTE — Progress Notes (Signed)
Subjective:    Patient ID: Christopher Howard, male    DOB: 05-Nov-1965, 49 y.o.   MRN: [id number]  HPI The patient is a 49 year old male who I've been asked to see for possible obstructive sleep apnea. He has been noted to have loud snoring and witnessed apneas by his bed partner, and is also had an issue with choking arousals. He has frequent awakenings at night, and does not feel rested in the mornings even when he has had many hours of sleep. He notes intermittent sleep pressure during the day with inactivity, but will fall asleep very easily in the evenings with television or movies. He has no issues with sleepiness while driving. The patient's weight is up about 20 pounds over the last 2 years, and his Epworth score today is 3.  Sleep Questionnaire What time do you typically go to bed?( Between what hours) varies depending on work schedule. If works days then tries b/w 11p-12a.  varies depending on work schedule. If works days then tries b/w 11p-12a.  at 1347 on 11/16/14 by Inge Rise, [place] How long does it take you to fall asleep? it varies it varies at 1347 on 11/16/14 by Inge Rise, CMA How many times during the night do you wake up? 3 3 at 1347 on 11/16/14 by Inge Rise, [place] What time do you get out of bed to start your day? 0600 0600 at 1347 on 11/16/14 by Inge Rise, CMA Do you drive or operate heavy machinery in your occupation? Yes Yes at 1347 on 11/16/14 by Inge Rise, CMA How much has your weight changed (up or down) over the past two years? (In pounds) 20 lb (9.072 kg) 20 lb (9.072 kg) at 1347 on 11/16/14 by Inge Rise, CMA Have you ever had a sleep study before? No No at 1347 on 11/16/14 by Inge Rise, CMA Do you currently use CPAP? No No at 1347 on 11/16/14 by Inge Rise, CMA Do you wear oxygen at any time? No No at 1347 on 11/16/14 by Inge Rise, CMA   Review of Systems  Constitutional: Negative for fever and unexpected weight change.   HENT: Positive for congestion, postnasal drip, rhinorrhea, sinus pressure and sneezing. Negative for dental problem, ear pain, nosebleeds, sore throat and trouble swallowing.   Eyes: Negative for redness and itching.  Respiratory: Negative for cough, chest tightness, shortness of breath and wheezing.   Cardiovascular: Negative for palpitations and leg swelling.  Gastrointestinal: Negative for nausea and vomiting.  Genitourinary: Negative for dysuria.  Musculoskeletal: Negative for joint swelling.  Skin: Negative for rash.  Neurological: Negative for headaches.  Hematological: Does not bruise/bleed easily.  Psychiatric/Behavioral: Negative for dysphoric mood. The patient is not nervous/anxious.        Objective:   Physical Exam Constitutional:  Overweight male, no acute distress  HENT:  Nares patent without discharge,deviated septum to left with narrowing.   Oropharynx without exudate, palate and uvula are moderately elongated.   Eyes:  Perrla, eomi, no scleral icterus  Neck:  No JVD, no TMG  Cardiovascular:  Normal rate, regular rhythm, no rubs or gallops.  No murmurs        Intact distal pulses  Pulmonary :  Normal breath sounds, no stridor or respiratory distress   No rales, rhonchi, or wheezing  Abdominal:  Soft, nondistended, bowel sounds present.  No tenderness noted.   Musculoskeletal:  No lower extremity edema noted.  Lymph Nodes:  No cervical lymphadenopathy noted  Skin:  No cyanosis noted  Neurologic:  Alert, appropriate, moves all 4 extremities without obvious deficit.         Assessment & Plan:

## 2014-11-16 NOTE — Patient Instructions (Signed)
Will do a home sleep test to evaluate for sleep apnea. Will arrange followup once the results are available.

## 2014-11-18 ENCOUNTER — Ambulatory Visit (HOSPITAL_BASED_OUTPATIENT_CLINIC_OR_DEPARTMENT_OTHER): Payer: 59 | Attending: Pulmonary Disease | Admitting: Radiology

## 2014-11-18 VITALS — Ht 70.0 in | Wt 240.0 lb

## 2014-11-18 DIAGNOSIS — G4710 Hypersomnia, unspecified: Secondary | ICD-10-CM | POA: Diagnosis present

## 2014-11-18 DIAGNOSIS — R0683 Snoring: Secondary | ICD-10-CM | POA: Diagnosis not present

## 2014-11-18 DIAGNOSIS — G4730 Sleep apnea, unspecified: Secondary | ICD-10-CM | POA: Insufficient documentation

## 2014-12-10 ENCOUNTER — Telehealth: Payer: Self-pay | Admitting: Pulmonary Disease

## 2014-12-10 DIAGNOSIS — G4733 Obstructive sleep apnea (adult) (pediatric): Secondary | ICD-10-CM | POA: Diagnosis not present

## 2014-12-10 NOTE — Telephone Encounter (Signed)
Spoke with pt, states aware that appt is needed for to review results.  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 Next available is May 13.  Pt is requesting something soon as he does not want to wait very long bc there was already a delay calling him. Please advise Dr Gwenette Greet if there is anywhere next week that the patient can be worked in for results or if TP can give results.  Thanks.

## 2014-12-10 NOTE — Telephone Encounter (Signed)
Pt returned call  [phone]

## 2014-12-10 NOTE — Telephone Encounter (Signed)
Pt needs ov soon to review sleep study results.  Apologize for the delay, and let him know that I just recently received his study for interpretation

## 2014-12-10 NOTE — Telephone Encounter (Signed)
lmomtcb x1 

## 2014-12-11 NOTE — Telephone Encounter (Signed)
lmomtcb x1 for pt Advised to ask to speak with Daksha Koone

## 2014-12-11 NOTE — Telephone Encounter (Signed)
Pt called back and is scheduled to see [facility] Tuesday at 4:30

## 2014-12-11 NOTE — Telephone Encounter (Signed)
Needs to see me Christopher Howard, see if we can get this worked out.  Thanks.

## 2014-12-15 ENCOUNTER — Ambulatory Visit (INDEPENDENT_AMBULATORY_CARE_PROVIDER_SITE_OTHER): Payer: 59 | Admitting: Pulmonary Disease

## 2014-12-15 ENCOUNTER — Encounter: Payer: Self-pay | Admitting: Pulmonary Disease

## 2014-12-15 VITALS — BP 118/60 | HR 81 | Temp 98.4°F | Ht 70.0 in | Wt 241.4 lb

## 2014-12-15 DIAGNOSIS — G4733 Obstructive sleep apnea (adult) (pediatric): Secondary | ICD-10-CM | POA: Diagnosis not present

## 2014-12-15 NOTE — Progress Notes (Signed)
   Subjective:    Patient ID: Christopher Howard, male    DOB: [birth year], 49 y.o.   MRN: [id number]  HPI The patient comes in today for follow-up of his recent home sleep test. He was found to have moderate OSA, with an AHI of 30 events per hour and oxygen desaturation transiently as low as 70%. I have reviewed his study with him in detail, and answered all of his questions.   Review of Systems  Constitutional: Negative for fever and unexpected weight change.  HENT: Negative for congestion, dental problem, ear pain, nosebleeds, postnasal drip, rhinorrhea, sinus pressure, sneezing, sore throat and trouble swallowing.   Eyes: Negative for redness and itching.  Respiratory: Negative for cough, chest tightness, shortness of breath and wheezing.   Cardiovascular: Negative for palpitations and leg swelling.  Gastrointestinal: Negative for nausea and vomiting.  Genitourinary: Negative for dysuria.  Musculoskeletal: Negative for joint swelling.  Skin: Negative for rash.  Neurological: Negative for headaches.  Hematological: Does not bruise/bleed easily.  Psychiatric/Behavioral: Negative for dysphoric mood. The patient is not nervous/anxious.        Objective:   Physical Exam Overweight male in no acute distress Nose without purulence or discharge noted Neck without lymphadenopathy or thyromegaly Lower extremities without edema, no cyanosis Alert and oriented, moves all 4 extremities.       Assessment & Plan:

## 2014-12-15 NOTE — Patient Instructions (Signed)
Will start you on cpap for treatment of your sleep apnea.  Please call if having issues with tolerance. Work on weight loss followup again in 12 weeks.

## 2014-12-15 NOTE — Assessment & Plan Note (Signed)
The patient has moderate obstructive sleep apnea by his recent home sleep test, and he feels this is significantly impacting his quality of life. Have reviewed a conservative path with a trial of weight loss alone, as well as a more aggressive path with C Pap while working on weight loss. The patient would like to try C Pap, and continue working on weight loss. I will set the patient up on cpap at a moderate pressure level to allow for desensitization, and will troubleshoot the device over the next 4-6weeks if needed.  The pt is to call me if having issues with tolerance.  Will then optimize the pressure once patient is able to wear cpap on a consistent basis.

## 2015-03-30 ENCOUNTER — Other Ambulatory Visit: Payer: Self-pay | Admitting: Family Medicine

## 2015-03-30 NOTE — Telephone Encounter (Signed)
Pt was last here in office on 09/30/14 and due for a refill.

## 2015-03-30 NOTE — Telephone Encounter (Signed)
Ok to refill for one month  

## 2015-04-19 IMAGING — CT CT ANGIO CHEST
2 of 9 series · 4 of 36 positions shown · IV contrast (Iodine)
Comparison: None.

CLINICAL DATA: SOB, elevated d-dimer

EXAM:
CT ANGIOGRAPHY CHEST WITH CONTRAST
TECHNIQUE: Multidetector CT imaging of the chest was performed using the
standard protocol during bolus administration of intravenous
contrast. Multiplanar CT image reconstructions and MIPs were
obtained to evaluate the vascular anatomy.
CONTRAST:  75mL OMNIPAQUE IOHEXOL 350 MG/ML SOLN

[Series 300: locator · axial · 0.43mm/px · 1 of 1 slices shown]
[im 1/1  lung]
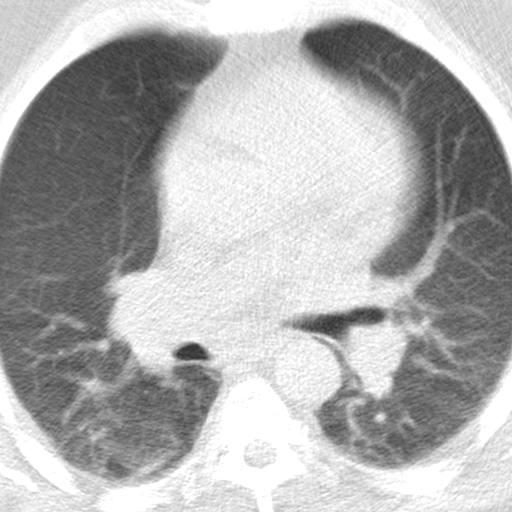

[Series 509: coronal mips · coronal · 0.45mm/px · 3 of 494 slices shown]
[im 124/494  mediastinal]
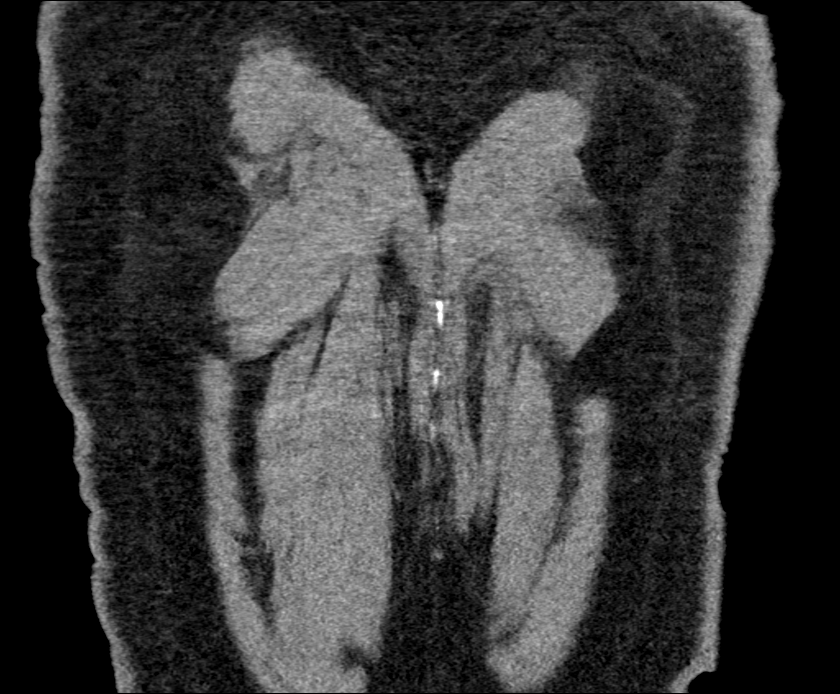
[im 247/494  mediastinal]
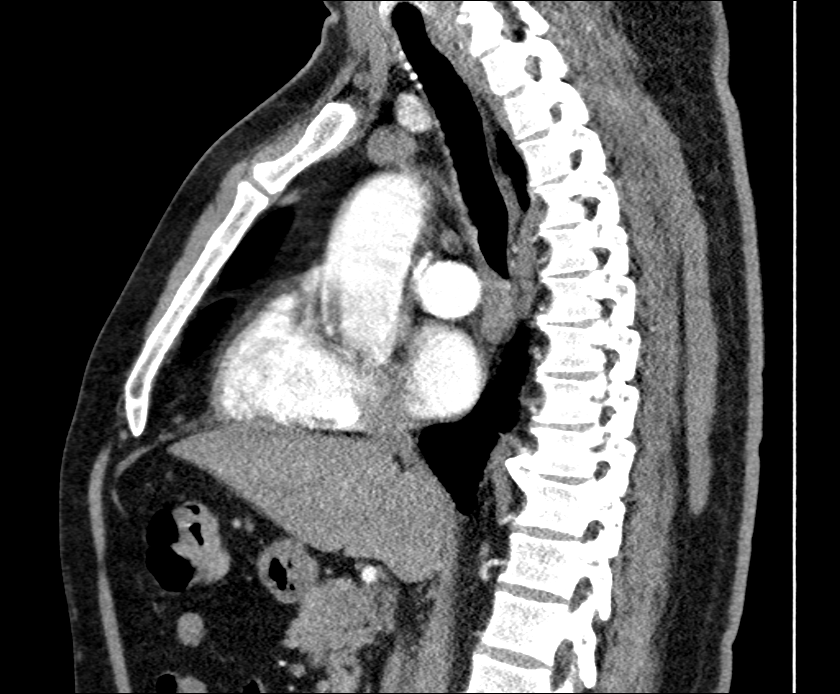
[im 370/494  mediastinal]
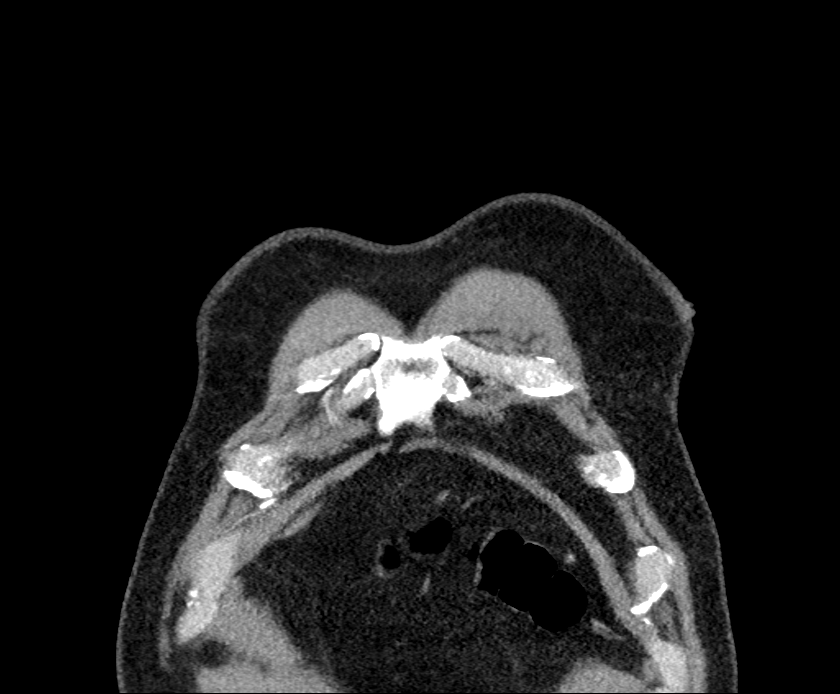

[4 of 36 positions shown; findings below may reference images not displayed]

FINDINGS: Satisfactory opacification of pulmonary arteries noted, and there is
no evidence of pulmonary emboli. Adequate contrast opacification of
the thoracic aorta with no evidence of dissection, aneurysm, or
stenosis. There is classic 3 vessel Brachiocephalic arch anatomy
without proximal stenosis. no pleural or pericardial effusion.
Subcentimeter AP window and precarinal lymph nodes. No hilar
adenopathy. Coarse dependent atelectasis or subpleural scarring
posteriorly in both lower lobes. Focal airspace consolidation in the
right middle lobe. Visualized portions of upper abdomen
unremarkable.

Review of the MIP images confirms the above findings.
IMPRESSION: 1. Right middle lobe pneumonia.
2. Negative for acute PE or thoracic aortic dissection.

## 2015-07-12 ENCOUNTER — Other Ambulatory Visit: Payer: Self-pay | Admitting: Adult Health

## 2015-07-14 NOTE — Telephone Encounter (Signed)
Call in #90 with one rf 

## 2015-07-14 NOTE — Telephone Encounter (Signed)
Rx called in 

## 2015-08-30 DIAGNOSIS — G4733 Obstructive sleep apnea (adult) (pediatric): Secondary | ICD-10-CM | POA: Diagnosis not present

## 2015-09-30 DIAGNOSIS — G4733 Obstructive sleep apnea (adult) (pediatric): Secondary | ICD-10-CM | POA: Diagnosis not present

## 2015-10-26 ENCOUNTER — Other Ambulatory Visit: Payer: Self-pay | Admitting: Family Medicine

## 2015-10-26 MED FILL — LEVOTHYROXINE 100 MCG TAB: 100 | 90 days supply | Qty: 90 | Fill #0

## 2015-10-26 MED FILL — LISINOPRIL-HCTZ 10-12.5 MG: 10-12.5 | 90 days supply | Qty: 90 | Fill #0

## 2015-10-26 MED FILL — PHENTERMINE 37.5 MG TABLET: 37.5 | 90 days supply | Qty: 90 | Fill #1

## 2015-10-28 DIAGNOSIS — G4733 Obstructive sleep apnea (adult) (pediatric): Secondary | ICD-10-CM | POA: Diagnosis not present

## 2015-11-05 DIAGNOSIS — G4733 Obstructive sleep apnea (adult) (pediatric): Secondary | ICD-10-CM | POA: Diagnosis not present

## 2015-11-08 ENCOUNTER — Telehealth: Payer: Self-pay | Admitting: Family Medicine

## 2015-11-08 DIAGNOSIS — E785 Hyperlipidemia, unspecified: Secondary | ICD-10-CM

## 2015-11-08 DIAGNOSIS — I10 Essential (primary) hypertension: Secondary | ICD-10-CM

## 2015-11-08 NOTE — Telephone Encounter (Signed)
Pt call to ask if he could have his labs drawn at [facility] for his physical

## 2015-11-09 NOTE — Telephone Encounter (Signed)
Yes please

## 2015-11-09 NOTE — Telephone Encounter (Signed)
Okay to order?

## 2015-11-12 ENCOUNTER — Other Ambulatory Visit (INDEPENDENT_AMBULATORY_CARE_PROVIDER_SITE_OTHER): Payer: 59

## 2015-11-12 DIAGNOSIS — R8290 Unspecified abnormal findings in urine: Secondary | ICD-10-CM | POA: Diagnosis not present

## 2015-11-12 DIAGNOSIS — I10 Essential (primary) hypertension: Secondary | ICD-10-CM

## 2015-11-12 DIAGNOSIS — E785 Hyperlipidemia, unspecified: Secondary | ICD-10-CM | POA: Diagnosis not present

## 2015-11-12 LAB — HEPATIC FUNCTION PANEL
ALBUMIN: 4.1 g/dL (ref 3.5–5.2)
ALK PHOS: 65 U/L (ref 39–117)
ALT: 10 U/L (ref 0–53)
AST: 13 U/L (ref 0–37)
BILIRUBIN TOTAL: 0.7 mg/dL (ref 0.2–1.2)
Bilirubin, Direct: 0.1 mg/dL (ref 0.0–0.3)
Total Protein: 7.2 g/dL (ref 6.0–8.3)

## 2015-11-12 LAB — BASIC METABOLIC PANEL
BUN: 26 mg/dL — AB (ref 6–23)
CHLORIDE: 102 meq/L (ref 96–112)
CO2: 29 mEq/L (ref 19–32)
CREATININE: 1.49 mg/dL (ref 0.40–1.50)
Calcium: 9.3 mg/dL (ref 8.4–10.5)
GFR: 63.65 mL/min (ref >60.00)
GLUCOSE: 84 mg/dL (ref 70–99)
Potassium: 4.4 mEq/L (ref 3.5–5.1)
Sodium: 137 mEq/L (ref 135–145)

## 2015-11-12 LAB — URINALYSIS
BILIRUBIN URINE: NEGATIVE
Hgb urine dipstick: NEGATIVE
KETONES UR: NEGATIVE
Leukocytes, UA: NEGATIVE
Nitrite: NEGATIVE
PH: 5.5 (ref 5.0–8.0)
SPECIFIC GRAVITY, URINE: 1.02 (ref 1.000–1.030)
TOTAL PROTEIN, URINE-UPE24: NEGATIVE
URINE GLUCOSE: NEGATIVE
Urobilinogen, UA: 0.2 (ref 0.0–1.0)

## 2015-11-12 LAB — LIPID PANEL
CHOLESTEROL: 178 mg/dL (ref 0–200)
HDL: 49.2 mg/dL (ref >39.00)
LDL Cholesterol: 112 mg/dL — ABNORMAL HIGH (ref 0–99)
NONHDL: 129.1
Total CHOL/HDL Ratio: 4
Triglycerides: 85 mg/dL (ref 0.0–149.0)
VLDL: 17 mg/dL (ref 0.0–40.0)

## 2015-11-12 LAB — CBC WITH DIFFERENTIAL/PLATELET
Basophils Absolute: 0 10*3/uL (ref 0.0–0.1)
Basophils Relative: 0.6 % (ref 0.0–3.0)
EOS PCT: 2.9 % (ref 0.0–5.0)
Eosinophils Absolute: 0.1 10*3/uL (ref 0.0–0.7)
HCT: 43.1 % (ref 39.0–52.0)
Hemoglobin: 14.3 g/dL (ref 13.0–17.0)
LYMPHS ABS: 1.4 10*3/uL (ref 0.7–4.0)
Lymphocytes Relative: 31.2 % (ref 12.0–46.0)
MCHC: 33 g/dL (ref 30.0–36.0)
MCV: 84.3 fl (ref 78.0–100.0)
MONO ABS: 0.5 10*3/uL (ref 0.1–1.0)
MONOS PCT: 10.9 % (ref 3.0–12.0)
NEUTROS ABS: 2.5 10*3/uL (ref 1.4–7.7)
NEUTROS PCT: 54.4 % (ref 43.0–77.0)
PLATELETS: 201 10*3/uL (ref 150.0–400.0)
RBC: 5.12 Mil/uL (ref 4.22–5.81)
RDW: 14.5 % (ref 11.5–15.5)
WBC: 4.6 10*3/uL (ref 4.0–10.5)

## 2015-11-12 LAB — PSA: PSA: 1.52 ng/mL (ref 0.10–4.00)

## 2015-11-12 LAB — TSH: TSH: 0.61 u[IU]/mL (ref 0.35–4.50)

## 2015-11-17 ENCOUNTER — Ambulatory Visit (INDEPENDENT_AMBULATORY_CARE_PROVIDER_SITE_OTHER): Payer: 59 | Admitting: Family Medicine

## 2015-11-17 ENCOUNTER — Encounter: Payer: Self-pay | Admitting: Family Medicine

## 2015-11-17 VITALS — BP 110/66 | HR 81 | Temp 98.2°F | Ht 70.0 in | Wt 241.0 lb

## 2015-11-17 DIAGNOSIS — Z0000 Encounter for general adult medical examination without abnormal findings: Secondary | ICD-10-CM | POA: Diagnosis not present

## 2015-11-17 DIAGNOSIS — R6882 Decreased libido: Secondary | ICD-10-CM | POA: Diagnosis not present

## 2015-11-17 LAB — TESTOSTERONE: Testosterone: 312.47 ng/dL (ref 300.00–890.00)

## 2015-11-17 MED ORDER — LEVOTHYROXINE SODIUM 100 MCG PO TABS: 100.0000 ug | ORAL_TABLET | Freq: Every day | ORAL | Status: DC

## 2015-11-17 MED ORDER — LISINOPRIL-HYDROCHLOROTHIAZIDE 10-12.5 MG PO TABS: 1.0000 | ORAL_TABLET | Freq: Every day | ORAL | Status: DC

## 2015-11-17 NOTE — Progress Notes (Signed)
Pre visit review using our clinic review tool, if applicable. No additional management support is needed unless otherwise documented below in the visit note. 

## 2015-11-17 NOTE — Progress Notes (Signed)
   Subjective:    Patient ID: Christopher Howard, male    DOB: July 26, 1966, 50 y.o.   MRN: [id number]  HPI 50 yr old male for a cpx. He has one concern today and that is to check his testosterone level, which has never been done. In the last year he has felt some mild generalized fatigue and he has seen a drop in his libido. No erection problems if he is interested in sex, he just is not interested as often. He is using his CPAP machine and feels his sleep is more restful.    Review of Systems  Constitutional: Positive for fatigue. Negative for fever, chills, diaphoresis, activity change, appetite change and unexpected weight change.  HENT: Negative.   Eyes: Negative.   Respiratory: Negative.   Cardiovascular: Negative.   Gastrointestinal: Negative.   Genitourinary: Negative.   Musculoskeletal: Negative.   Skin: Negative.   Neurological: Negative.   Psychiatric/Behavioral: Negative.        Objective:   Physical Exam  Constitutional: He is oriented to person, place, and time. He appears well-developed and well-nourished. No distress.  HENT:  Head: Normocephalic and atraumatic.  Right Ear: External ear normal.  Left Ear: External ear normal.  Nose: Nose normal.  Mouth/Throat: Oropharynx is clear and moist. No oropharyngeal exudate.  Eyes: Conjunctivae and EOM are normal. Pupils are equal, round, and reactive to light. Right eye exhibits no discharge. Left eye exhibits no discharge. No scleral icterus.  Neck: Neck supple. No JVD present. No tracheal deviation present. No thyromegaly present.  Cardiovascular: Normal rate, regular rhythm, normal heart sounds and intact distal pulses.  Exam reveals no gallop and no friction rub.   No murmur heard. EKG normal   Pulmonary/Chest: Effort normal and breath sounds normal. No respiratory distress. He has no wheezes. He has no rales. He exhibits no tenderness.  Abdominal: Soft. Bowel sounds are normal. He exhibits no distension and no mass. There  is no tenderness. There is no rebound and no guarding.  Genitourinary: Rectum normal, prostate normal and penis normal. Guaiac negative stool. No penile tenderness.  Musculoskeletal: Normal range of motion. He exhibits no edema or tenderness.  Lymphadenopathy:    He has no cervical adenopathy.  Neurological: He is alert and oriented to person, place, and time. He has normal reflexes. No cranial nerve deficit. He exhibits normal muscle tone. Coordination normal.  Skin: Skin is warm and dry. No rash noted. He is not diaphoretic. No erythema. No pallor.  Psychiatric: He has a normal mood and affect. His behavior is normal. Judgment and thought content normal.          Assessment & Plan:  Well exam. We discussed diet and exercise. Check a testosterone level. Laurey Morale, MD

## 2015-11-19 ENCOUNTER — Encounter: Payer: Self-pay | Admitting: Family Medicine

## 2015-12-08 ENCOUNTER — Encounter: Payer: Self-pay | Admitting: Family Medicine

## 2015-12-09 NOTE — Telephone Encounter (Signed)
Unfortunately insurance will not cover treatment for this unless the level is truly below the range. He could either try vigorous exercise to raise the level naturally, or he could pay cash for a medication. Let me know if he wants a rx

## 2015-12-10 ENCOUNTER — Other Ambulatory Visit: Payer: Self-pay | Admitting: Family Medicine

## 2015-12-10 MED ORDER — TESTOSTERONE 50 MG/5GM (1%) TD GEL: 5.0000 g | Freq: Every day | TRANSDERMAL | Status: DC

## 2015-12-10 NOTE — Telephone Encounter (Signed)
Call in Testim 1% gel, to apply one tube (5 gm) daily, 6 month supply

## 2015-12-10 NOTE — Telephone Encounter (Signed)
Rx has been call in

## 2015-12-17 MED FILL — TESTOSTERONE 50 MG/5 GRAM P: 50 MG/5GM | 30 days supply | Qty: 150 | Fill #0

## 2016-01-10 DIAGNOSIS — H52221 Regular astigmatism, right eye: Secondary | ICD-10-CM | POA: Diagnosis not present

## 2016-01-10 DIAGNOSIS — H524 Presbyopia: Secondary | ICD-10-CM | POA: Diagnosis not present

## 2016-01-10 DIAGNOSIS — H5203 Hypermetropia, bilateral: Secondary | ICD-10-CM | POA: Diagnosis not present

## 2016-02-07 MED FILL — LEVOTHYROXINE 100 MCG TAB: 100 | 90 days supply | Qty: 90 | Fill #0 | Status: TO

## 2016-02-07 MED FILL — LISINOPRIL-HCTZ 10-12.5 MG: 10-12.5 | 90 days supply | Qty: 90 | Fill #0 | Status: TO

## 2016-03-06 DIAGNOSIS — G4733 Obstructive sleep apnea (adult) (pediatric): Secondary | ICD-10-CM | POA: Diagnosis not present

## 2016-05-17 ENCOUNTER — Other Ambulatory Visit: Payer: Self-pay | Admitting: Family Medicine

## 2016-05-29 MED FILL — LEVOTHYROXINE 100 MCG TAB: 100 | 90 days supply | Qty: 90 | Fill #0 | Status: TO

## 2016-05-29 MED FILL — LISINOPRIL-HCTZ 10-12.5 MG: 10-12.5 | 90 days supply | Qty: 90 | Fill #0 | Status: TO

## 2016-06-05 ENCOUNTER — Encounter: Payer: Self-pay | Admitting: Family Medicine

## 2016-06-06 NOTE — Telephone Encounter (Signed)
Tell him to stop the BP pill for now and to check his BP twice a day. Let us know in 2 weeks what his readings are

## 2016-06-08 DIAGNOSIS — G4733 Obstructive sleep apnea (adult) (pediatric): Secondary | ICD-10-CM | POA: Diagnosis not present

## 2016-07-03 ENCOUNTER — Encounter: Payer: Self-pay | Admitting: Family Medicine

## 2016-07-05 NOTE — Telephone Encounter (Signed)
Just a report of BP about 3 days a week would be fine. So far it looks great

## 2016-07-06 ENCOUNTER — Encounter: Payer: Self-pay | Admitting: Family Medicine

## 2016-07-06 NOTE — Telephone Encounter (Signed)
Pay NO attention to the media report. His BP of 130s over 80s is at goal. We do not need to lower this any further

## 2016-09-22 MED FILL — LISINOPRIL-HCTZ 10-12.5 MG: 10-12.5 | 90 days supply | Qty: 90 | Fill #1 | Status: TO

## 2016-09-22 MED FILL — LEVOTHYROXINE 100 MCG TABLE: 100 | 90 days supply | Qty: 90 | Fill #1 | Status: TO

## 2016-10-26 ENCOUNTER — Encounter: Payer: Self-pay | Admitting: Family Medicine

## 2016-10-27 ENCOUNTER — Other Ambulatory Visit: Payer: Self-pay | Admitting: Family Medicine

## 2016-10-27 DIAGNOSIS — Z0000 Encounter for general adult medical examination without abnormal findings: Secondary | ICD-10-CM

## 2016-10-27 DIAGNOSIS — E291 Testicular hypofunction: Secondary | ICD-10-CM

## 2016-10-27 NOTE — Telephone Encounter (Signed)
Pt would like to have his labs done at [place], [place] or [facility] he states this is the way he always do it.

## 2016-10-27 NOTE — Telephone Encounter (Signed)
Pt needs to schedule a lab appointment for CPE, I did put in order for extra lab, PSA & Testosterone.

## 2016-10-30 ENCOUNTER — Other Ambulatory Visit (INDEPENDENT_AMBULATORY_CARE_PROVIDER_SITE_OTHER): Payer: 59

## 2016-10-30 DIAGNOSIS — E291 Testicular hypofunction: Secondary | ICD-10-CM

## 2016-10-30 DIAGNOSIS — Z0000 Encounter for general adult medical examination without abnormal findings: Secondary | ICD-10-CM

## 2016-10-30 LAB — CBC WITH DIFFERENTIAL/PLATELET
BASOS ABS: 0 10*3/uL (ref 0.0–0.1)
Basophils Relative: 1.1 % (ref 0.0–3.0)
Eosinophils Absolute: 0.5 10*3/uL (ref 0.0–0.7)
Eosinophils Relative: 12.5 % — ABNORMAL HIGH (ref 0.0–5.0)
HCT: 42.8 % (ref 39.0–52.0)
Hemoglobin: 14.3 g/dL (ref 13.0–17.0)
LYMPHS ABS: 1.3 10*3/uL (ref 0.7–4.0)
Lymphocytes Relative: 29.6 % (ref 12.0–46.0)
MCHC: 33.5 g/dL (ref 30.0–36.0)
MCV: 85.8 fl (ref 78.0–100.0)
MONOS PCT: 9.4 % (ref 3.0–12.0)
Monocytes Absolute: 0.4 10*3/uL (ref 0.1–1.0)
NEUTROS ABS: 2 10*3/uL (ref 1.4–7.7)
Neutrophils Relative %: 47.4 % (ref 43.0–77.0)
PLATELETS: 174 10*3/uL (ref 150.0–400.0)
RBC: 4.98 Mil/uL (ref 4.22–5.81)
RDW: 14.7 % (ref 11.5–15.5)
WBC: 4.3 10*3/uL (ref 4.0–10.5)

## 2016-10-30 LAB — POC URINALSYSI DIPSTICK (AUTOMATED)
BILIRUBIN UA: NEGATIVE
Blood, UA: NEGATIVE
Glucose, UA: NEGATIVE
KETONES UA: NEGATIVE
Leukocytes, UA: NEGATIVE
NITRITE UA: NEGATIVE
PH UA: 5
PROTEIN UA: NEGATIVE
Spec Grav, UA: 1.02
Urobilinogen, UA: 0.2

## 2016-10-30 LAB — BASIC METABOLIC PANEL
BUN: 19 mg/dL (ref 6–23)
CALCIUM: 9.6 mg/dL (ref 8.4–10.5)
CO2: 27 meq/L (ref 19–32)
Chloride: 106 mEq/L (ref 96–112)
Creatinine, Ser: 1.38 mg/dL (ref 0.40–1.50)
GFR: 69.29 mL/min (ref >60.00)
Glucose, Bld: 95 mg/dL (ref 70–99)
Potassium: 4.3 mEq/L (ref 3.5–5.1)
SODIUM: 139 meq/L (ref 135–145)

## 2016-10-30 LAB — HEPATIC FUNCTION PANEL
ALBUMIN: 4.2 g/dL (ref 3.5–5.2)
ALK PHOS: 78 U/L (ref 39–117)
ALT: 12 U/L (ref 0–53)
AST: 15 U/L (ref 0–37)
Bilirubin, Direct: 0.1 mg/dL (ref 0.0–0.3)
Total Bilirubin: 0.4 mg/dL (ref 0.2–1.2)
Total Protein: 7.1 g/dL (ref 6.0–8.3)

## 2016-10-30 LAB — LIPID PANEL
CHOL/HDL RATIO: 4
CHOLESTEROL: 186 mg/dL (ref 0–200)
HDL: 49.6 mg/dL (ref >39.00)
LDL Cholesterol: 121 mg/dL — ABNORMAL HIGH (ref 0–99)
NonHDL: 136.43
TRIGLYCERIDES: 76 mg/dL (ref 0.0–149.0)
VLDL: 15.2 mg/dL (ref 0.0–40.0)

## 2016-10-30 NOTE — Telephone Encounter (Signed)
Pt had labs done this morning.

## 2016-10-31 LAB — PSA: PSA: 1.4 ng/mL (ref 0.10–4.00)

## 2016-10-31 LAB — TESTOSTERONE: Testosterone: 275.04 ng/dL — ABNORMAL LOW (ref 300.00–890.00)

## 2016-10-31 LAB — TSH: TSH: 3.2 u[IU]/mL (ref 0.35–4.50)

## 2016-11-01 ENCOUNTER — Ambulatory Visit (INDEPENDENT_AMBULATORY_CARE_PROVIDER_SITE_OTHER): Payer: 59 | Admitting: Family Medicine

## 2016-11-01 ENCOUNTER — Encounter: Payer: Self-pay | Admitting: Family Medicine

## 2016-11-01 VITALS — BP 112/70 | HR 77 | Temp 98.5°F | Ht 70.0 in | Wt 235.0 lb

## 2016-11-01 DIAGNOSIS — E039 Hypothyroidism, unspecified: Secondary | ICD-10-CM | POA: Diagnosis not present

## 2016-11-01 DIAGNOSIS — Z0000 Encounter for general adult medical examination without abnormal findings: Secondary | ICD-10-CM

## 2016-11-01 MED ORDER — ASPIRIN EC 81 MG PO TBEC: 81.0000 mg | DELAYED_RELEASE_TABLET | Freq: Every day | ORAL | 0 refills | Status: AC

## 2016-11-01 MED ORDER — LISINOPRIL-HYDROCHLOROTHIAZIDE 10-12.5 MG PO TABS: 1.0000 | ORAL_TABLET | Freq: Every day | ORAL | 3 refills | Status: DC

## 2016-11-01 NOTE — Patient Instructions (Signed)
WE NOW OFFER   [facility] FAST TRACK!!!  SAME DAY Appointments for ACUTE CARE  Such as: Sprains, Injuries, cuts, abrasions, rashes, muscle pain, joint pain, back pain Colds, flu, sore throats, headache, allergies, cough, fever  Ear pain, sinus and eye infections Abdominal pain, nausea, vomiting, diarrhea, upset stomach Animal/insect bites  3 Easy Ways to Schedule: Walk-In Scheduling Call in scheduling Mychart Sign-up: [email or web address]         

## 2016-11-01 NOTE — Progress Notes (Signed)
   Subjective:    Patient ID: Christopher Howard, male    DOB: 1966-04-16, 51 y.o.   MRN: [id number]  HPI 51 yr old male for a well exam. He feels fine and he is trying to lose weight. He is restricting his daily caloric intake to 1900 calories and he is exercising 7 days a week. He does feel tired a lot however, and he asks about his thyroid levels. His recent TSH is in the normal range at 3.20, but this is higher than his usual numbers have been running.    Review of Systems  Constitutional: Negative.   HENT: Negative.   Eyes: Negative.   Respiratory: Negative.   Cardiovascular: Negative.   Gastrointestinal: Negative.   Genitourinary: Negative.   Musculoskeletal: Negative.   Skin: Negative.   Neurological: Negative.   Psychiatric/Behavioral: Negative.        Objective:   Physical Exam  Constitutional: He is oriented to person, place, and time. He appears well-developed and well-nourished. No distress.  HENT:  Head: Normocephalic and atraumatic.  Right Ear: External ear normal.  Left Ear: External ear normal.  Nose: Nose normal.  Mouth/Throat: Oropharynx is clear and moist. No oropharyngeal exudate.  Eyes: Conjunctivae and EOM are normal. Pupils are equal, round, and reactive to light. Right eye exhibits no discharge. Left eye exhibits no discharge. No scleral icterus.  Neck: Neck supple. No JVD present. No tracheal deviation present. No thyromegaly present.  Cardiovascular: Normal rate, regular rhythm, normal heart sounds and intact distal pulses.  Exam reveals no gallop and no friction rub.   No murmur heard. Pulmonary/Chest: Effort normal and breath sounds normal. No respiratory distress. He has no wheezes. He has no rales. He exhibits no tenderness.  Abdominal: Soft. Bowel sounds are normal. He exhibits no distension and no mass. There is no tenderness. There is no rebound and no guarding.  Genitourinary: Rectum normal, prostate normal and penis normal. Rectal exam shows  guaiac negative stool. No penile tenderness.  Musculoskeletal: Normal range of motion. He exhibits no edema or tenderness.  Lymphadenopathy:    He has no cervical adenopathy.  Neurological: He is alert and oriented to person, place, and time. He has normal reflexes. No cranial nerve deficit. He exhibits normal muscle tone. Coordination normal.  Skin: Skin is warm and dry. No rash noted. He is not diaphoretic. No erythema. No pallor.  Psychiatric: He has a normal mood and affect. His behavior is normal. Judgment and thought content normal.          Assessment & Plan:  Well exam. We discussed diet and exercise. We will recheck a TSH today and add a free T3 and free T4.  Alysia Penna, MD

## 2016-11-01 NOTE — Progress Notes (Signed)
Pre visit review using our clinic review tool, if applicable. No additional management support is needed unless otherwise documented below in the visit note. 

## 2016-11-02 LAB — T3, FREE: T3, Free: 3.3 pg/mL (ref 2.3–4.2)

## 2016-11-02 LAB — TSH: TSH: 3.12 u[IU]/mL (ref 0.35–4.50)

## 2016-11-02 LAB — T4, FREE: Free T4: 0.77 ng/dL (ref 0.60–1.60)

## 2016-11-07 ENCOUNTER — Encounter: Payer: Self-pay | Admitting: Family Medicine

## 2016-11-08 ENCOUNTER — Other Ambulatory Visit: Payer: Self-pay | Admitting: Family Medicine

## 2016-11-08 MED ORDER — LEVOTHYROXINE SODIUM 137 MCG PO TABS: 137.0000 ug | ORAL_TABLET | Freq: Every day | ORAL | 0 refills | Status: DC

## 2016-11-08 MED FILL — LEVOTHYROXINE 137 MCG TAB: 137 | 90 days supply | Qty: 90 | Fill #0

## 2016-11-08 NOTE — Telephone Encounter (Signed)
We do have room to go up on the Synthroid dose, as we discussed. Increase this to 137 mcg daily. Call in #90 and recheck a TSH in 90 days

## 2016-11-10 DIAGNOSIS — H524 Presbyopia: Secondary | ICD-10-CM | POA: Diagnosis not present

## 2016-11-10 DIAGNOSIS — H5203 Hypermetropia, bilateral: Secondary | ICD-10-CM | POA: Diagnosis not present

## 2016-11-23 DIAGNOSIS — G4733 Obstructive sleep apnea (adult) (pediatric): Secondary | ICD-10-CM | POA: Diagnosis not present

## 2017-01-26 ENCOUNTER — Encounter: Payer: Self-pay | Admitting: Family Medicine

## 2017-01-26 NOTE — Telephone Encounter (Signed)
Whenever we change the dose of thyroid medication, we will check levels 90 days later so we can set up a TSH for then. As for the Lisinopril I have never heard of this causing joint pains.

## 2017-03-05 ENCOUNTER — Encounter: Payer: Self-pay | Admitting: Family Medicine

## 2017-03-05 ENCOUNTER — Other Ambulatory Visit: Payer: Self-pay | Admitting: Family Medicine

## 2017-03-05 DIAGNOSIS — E039 Hypothyroidism, unspecified: Secondary | ICD-10-CM

## 2017-03-05 MED ORDER — LEVOTHYROXINE SODIUM 137 MCG PO TABS: 137.0000 ug | ORAL_TABLET | Freq: Every day | ORAL | 3 refills | Status: DC

## 2017-03-05 MED FILL — LEVOTHYROXINE 137 MCG TAB: 137 | 90 days supply | Qty: 90 | Fill #0

## 2017-03-05 NOTE — Telephone Encounter (Signed)
I ordered the labs.

## 2017-03-06 ENCOUNTER — Other Ambulatory Visit (INDEPENDENT_AMBULATORY_CARE_PROVIDER_SITE_OTHER): Payer: 59

## 2017-03-06 DIAGNOSIS — E039 Hypothyroidism, unspecified: Secondary | ICD-10-CM

## 2017-03-06 LAB — T4, FREE: Free T4: 0.81 ng/dL (ref 0.60–1.60)

## 2017-03-06 LAB — T3, FREE: T3, Free: 3.2 pg/mL (ref 2.3–4.2)

## 2017-03-06 LAB — TSH: TSH: 0.18 u[IU]/mL — AB (ref 0.35–4.50)

## 2017-04-09 DIAGNOSIS — G4733 Obstructive sleep apnea (adult) (pediatric): Secondary | ICD-10-CM | POA: Diagnosis not present

## 2017-04-17 MED FILL — LISINOPRIL-HCTZ 10-12.5 MG: 10-12.5 | 90 days supply | Qty: 90 | Fill #2 | Status: TO

## 2017-05-10 ENCOUNTER — Encounter: Payer: Self-pay | Admitting: Family Medicine

## 2017-05-10 DIAGNOSIS — E039 Hypothyroidism, unspecified: Secondary | ICD-10-CM

## 2017-05-11 ENCOUNTER — Encounter: Payer: Self-pay | Admitting: Family Medicine

## 2017-05-11 NOTE — Telephone Encounter (Signed)
Try Artifical Tears eye drops

## 2017-05-11 NOTE — Telephone Encounter (Signed)
I think the best thing is to discuss this with an Endocrine specialist and they can do further testing. I did the referral today

## 2017-05-24 NOTE — Progress Notes (Signed)
Patient ID: Christopher Howard, male   DOB: 1966-02-10, 51 y.o.   MRN: [id number]           Referring Physician: Alysia Penna  Reason for Appointment:  Hypothyroidism, new visit    History of Present Illness:   Hypothyroidism was first diagnosed in early 2013 after treatment for Graves' disease with radioactive iodine  He has been on thyroid supplementation since then and followed fairly regularly For several years he had been taking a dose of 100 g of levothyroxine This was increased in 3/18 up 237, at that time his TSH was 3.1  Overall for the last few years a patient does complain of fatigue which is variable and he has had other medical problems ongoing also With changing the dose in March he did not feel any difference in his energy level  He does still feel somewhat fatigued but usually if he is resting well at night he has not had a problem with fatigue Also feels better with starting CPAP treatment No symptoms of cold sensitivity or difficulty concentrating but he has had weight gain this year  His TSH in July was 0.18 and he has been referred here for further management         Patient's weight history is as follows:  Wt Readings from Last 3 Encounters:  05/25/17 250 lb 12.8 oz (113.8 kg)  11/01/16 235 lb (106.6 kg)  11/17/15 241 lb (109.3 kg)    Thyroid function results have been as follows:  Lab Results  Component Value Date   TSH 0.18 (L) 03/06/2017   TSH 3.12 11/01/2016   TSH 3.20 10/30/2016   TSH 0.61 11/12/2015   FREET4 0.81 03/06/2017   FREET4 0.77 11/01/2016   FREET4 1.02 10/04/2011   FREET4 0.50 (L) 09/01/2011   T3FREE 3.2 03/06/2017   T3FREE 3.3 11/01/2016   T3FREE 9.4 (H) 05/03/2011     Past Medical History:  Diagnosis Date  . Diffuse toxic goiter    had radioiodine ablation on 06-23-11  . GERD (gastroesophageal reflux disease)    pt denies GERD  . Hyperlipidemia   . Hypothyroidism (acquired)    sees Dr. Loanne Drilling   . Pneumonia 12/2013  .  Seasonal allergies     Past Surgical History:  Procedure Laterality Date  . COLONOSCOPY  06-26-14   per Dr. Fuller Plan, benign polyps, repeat in 10 yrs   . laser surgery mouth  07/2013 & 08/2013  . THYROIDECTOMY    . WISDOM TOOTH EXTRACTION      Family History  Problem Relation Age of Onset  . Throat cancer Father   . Heart attack Father   . Arthritis Other   . Cancer Other        colon  . Diabetes Other   . Kidney disease Other   . Stroke Other   . Heart disease Other   . Stroke Mother   . Colon cancer Neg Hx   . Esophageal cancer Neg Hx   . Rectal cancer Neg Hx   . Stomach cancer Neg Hx     Social History:  reports that he quit smoking about 4 years ago. His smoking use included Cigarettes. He has a 5.00 pack-year smoking history. He has never used smokeless tobacco. He reports that he does not drink alcohol or use drugs.  Allergies:  Allergies  Allergen Reactions  . Ketoconazole Other (See Comments)    Pain all over body    Allergies as of 05/25/2017  Reactions   Ketoconazole Other (See Comments)   Pain all over body      Medication List       Accurate as of 05/25/17  1:08 PM. Always use your most recent med list.          aspirin EC 81 MG tablet Take 1 tablet (81 mg total) by mouth daily.   levothyroxine 137 MCG tablet Commonly known as:  SYNTHROID Take 1 tablet (137 mcg total) by mouth daily before breakfast.   lisinopril-hydrochlorothiazide 10-12.5 MG tablet Commonly known as:  PRINZIDE,ZESTORETIC Take 1 tablet by mouth daily.   testosterone 50 MG/5GM (1%) Gel Commonly known as:  ANDROGEL Place 5 g onto the skin daily.          Review of Systems  Constitutional: Positive for weight gain.  HENT: Negative for trouble swallowing.   Eyes: Negative for blurred vision.  Respiratory: Positive for shortness of breath.   Gastrointestinal: Negative for constipation and diarrhea.  Endocrine: Positive for fatigue and erectile dysfunction. Negative  for general weakness.       Fatigue has been present for 6-7 years  Genitourinary: Negative for nocturia.  Musculoskeletal: Positive for joint pain.  Neurological: Negative for numbness and tingling.  Psychiatric/Behavioral: Positive for insomnia.             Examination:    BP 110/80   Pulse 66   Ht 5' 8.75" (1.746 m)   Wt 250 lb 12.8 oz (113.8 kg)   SpO2 95%   BMI 37.31 kg/m   GENERAL:  Large build.  Moderate generalized obesity present  No pallor, clubbing, lymphadenopathy or edema.    Skin:  no rash.  He has mild acanthosis of the neck present.  EYES:  No prominence of the eyes or swelling of the eyelids  ENT: Oral mucosa and tongue normal.  THYROID:  Not palpable.  HEART:  Normal  S1 and S2; no murmur or click.  CHEST:   Mild gynecomastia especially the left side present   Lungs: Vescicular breath sounds heard equally.  No crepitations/ wheeze.  ABDOMEN:  No distention.  Liver and spleen not palpable.  No other mass or tenderness. Right testicle is atrophic  NEUROLOGICAL: Reflexes are bilaterally  1+ at biceps and absent at ankles.  JOINTS:  Normal peripheral joints.   Assessment:  HYPOTHYROIDISM, post ablative His dosage was increased in 3/18 resulting in a low TSH level subsequently in July  However he has not felt subjectively any different with the dose change and explained to him that his fatigue is not related to his thyroid levels Also explained to him that the lower TSH level is indicated above and excessive levothyroxine dose and there is no significant change in the free T4 level which is less sensitive to medication changes  FATIGUE: He has mild nonspecific fatigue, generally related to his sleep quality or duration. 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  PLAN:   He will come back in the morning on Monday for fasting labs including the following: Full thyroid panel Free testosterone, LH and prolactin CBC  to rule out anemia  Fasting glucose to rule out impaired fasting glucose  Adjustment of his levothyroxine dose will be done based on his lab results, likely needs the lower dose of 125 g  Discussed that he needs to discuss his shortness of breath on exertion with PCP  Follow-up to be decided   [facility] 05/25/2017, 1:08 PM   Consultation note copy sent to the PCP  Note: This office note was prepared with Dragon voice recognition system technology. Any transcriptional errors that result from this process are unintentional.

## 2017-05-25 ENCOUNTER — Encounter: Payer: Self-pay | Admitting: Endocrinology

## 2017-05-25 ENCOUNTER — Ambulatory Visit (INDEPENDENT_AMBULATORY_CARE_PROVIDER_SITE_OTHER): Payer: 59 | Admitting: Endocrinology

## 2017-05-25 VITALS — BP 110/80 | HR 66 | Ht 68.75 in | Wt 250.8 lb

## 2017-05-25 DIAGNOSIS — E890 Postprocedural hypothyroidism: Secondary | ICD-10-CM | POA: Diagnosis not present

## 2017-05-25 DIAGNOSIS — E291 Testicular hypofunction: Secondary | ICD-10-CM

## 2017-05-25 DIAGNOSIS — R5383 Other fatigue: Secondary | ICD-10-CM | POA: Diagnosis not present

## 2017-05-25 DIAGNOSIS — Z923 Personal history of irradiation: Secondary | ICD-10-CM

## 2017-05-28 ENCOUNTER — Other Ambulatory Visit (INDEPENDENT_AMBULATORY_CARE_PROVIDER_SITE_OTHER): Payer: 59

## 2017-05-28 DIAGNOSIS — E890 Postprocedural hypothyroidism: Secondary | ICD-10-CM | POA: Diagnosis not present

## 2017-05-28 DIAGNOSIS — E291 Testicular hypofunction: Secondary | ICD-10-CM

## 2017-05-28 DIAGNOSIS — R5383 Other fatigue: Secondary | ICD-10-CM | POA: Diagnosis not present

## 2017-05-28 LAB — CBC WITH DIFFERENTIAL/PLATELET
BASOS ABS: 0 10*3/uL (ref 0.0–0.1)
Basophils Relative: 1.4 % (ref 0.0–3.0)
EOS ABS: 0.2 10*3/uL (ref 0.0–0.7)
Eosinophils Relative: 5.1 % — ABNORMAL HIGH (ref 0.0–5.0)
HCT: 40.9 % (ref 39.0–52.0)
Hemoglobin: 13.4 g/dL (ref 13.0–17.0)
LYMPHS ABS: 1.2 10*3/uL (ref 0.7–4.0)
Lymphocytes Relative: 35.7 % (ref 12.0–46.0)
MCHC: 32.8 g/dL (ref 30.0–36.0)
MCV: 86.5 fl (ref 78.0–100.0)
MONOS PCT: 13.2 % — AB (ref 3.0–12.0)
Monocytes Absolute: 0.4 10*3/uL (ref 0.1–1.0)
NEUTROS ABS: 1.5 10*3/uL (ref 1.4–7.7)
NEUTROS PCT: 44.6 % (ref 43.0–77.0)
PLATELETS: 193 10*3/uL (ref 150.0–400.0)
RBC: 4.73 Mil/uL (ref 4.22–5.81)
RDW: 14.4 % (ref 11.5–15.5)
WBC: 3.4 10*3/uL — ABNORMAL LOW (ref 4.0–10.5)

## 2017-05-28 LAB — T4, FREE: FREE T4: 1.22 ng/dL (ref 0.60–1.60)

## 2017-05-28 LAB — T3, FREE: T3 FREE: 3.4 pg/mL (ref 2.3–4.2)

## 2017-05-28 LAB — GLUCOSE, RANDOM: GLUCOSE: 91 mg/dL (ref 70–99)

## 2017-05-28 LAB — LUTEINIZING HORMONE: LH: 5.11 m[IU]/mL (ref 1.50–9.30)

## 2017-05-28 LAB — TSH: TSH: 0.15 u[IU]/mL — AB (ref 0.35–4.50)

## 2017-05-29 LAB — PROLACTIN: PROLACTIN: 8.1 ng/mL (ref 2.0–18.0)

## 2017-05-31 ENCOUNTER — Other Ambulatory Visit: Payer: Self-pay

## 2017-05-31 ENCOUNTER — Encounter: Payer: Self-pay | Admitting: Endocrinology

## 2017-05-31 MED ORDER — LEVOTHYROXINE SODIUM 125 MCG PO CAPS: 1.0000 | ORAL_CAPSULE | Freq: Every day | ORAL | 0 refills | Status: DC

## 2017-05-31 MED FILL — LEVOTHYROXINE 125 MCG TABLE: 125 | 30 days supply | Qty: 30 | Fill #0

## 2017-06-01 LAB — TESTOS,TOTAL,FREE AND SHBG (FEMALE)
FREE TESTOSTERONE: 47.2 pg/mL (ref 35.0–155.0)
SEX HORMONE BINDING: 33 nmol/L (ref 10–50)
Testosterone, Total, LC-MS-MS: 433 ng/dL (ref 250–1100)

## 2017-07-05 NOTE — Telephone Encounter (Signed)
[facility]othy[facility]

## 2017-07-09 DIAGNOSIS — G4733 Obstructive sleep apnea (adult) (pediatric): Secondary | ICD-10-CM | POA: Diagnosis not present

## 2017-07-13 ENCOUNTER — Other Ambulatory Visit: Payer: Self-pay | Admitting: Endocrinology

## 2017-07-16 MED FILL — LEVOTHYROXINE 125 MCG TAB: 125 | 30 days supply | Qty: 30 | Fill #0

## 2017-07-23 ENCOUNTER — Other Ambulatory Visit: Payer: Self-pay | Admitting: Family Medicine

## 2017-07-23 ENCOUNTER — Telehealth: Payer: Self-pay | Admitting: Family Medicine

## 2017-07-23 NOTE — Telephone Encounter (Signed)
Copied from [place]. Topic: Quick Communication - See Telephone Encounter >> Jul 23, 2017  3:41 PM Boyd Kerbs wrote: CRM for notification. See Telephone encounter for:   Patient called [facility] sent escript for Lisinopril, no refills left.    07/23/17.

## 2017-07-24 MED FILL — LISINOPRIL-HCTZ 10-12.5 MG: 10-12.5 | 90 days supply | Qty: 90 | Fill #0 | Status: TO

## 2017-07-24 NOTE — Telephone Encounter (Signed)
Rx was sent to [facility] on 12/3 called and spoke to pt's wife she stated that she will let her husband know.

## 2017-07-30 ENCOUNTER — Other Ambulatory Visit: Payer: 59

## 2017-08-02 ENCOUNTER — Ambulatory Visit: Payer: 59 | Admitting: Endocrinology

## 2017-08-27 MED FILL — LEVOTHYROXINE 125 MCG TAB: 125 | 30 days supply | Qty: 30 | Fill #1

## 2017-09-02 ENCOUNTER — Other Ambulatory Visit: Payer: Self-pay | Admitting: Endocrinology

## 2017-09-02 DIAGNOSIS — E890 Postprocedural hypothyroidism: Secondary | ICD-10-CM

## 2017-09-03 ENCOUNTER — Other Ambulatory Visit: Payer: No Typology Code available for payment source

## 2017-09-03 DIAGNOSIS — E890 Postprocedural hypothyroidism: Secondary | ICD-10-CM | POA: Diagnosis not present

## 2017-09-03 LAB — TSH: TSH: 0.65 u[IU]/mL (ref 0.35–4.50)

## 2017-09-03 LAB — T4, FREE: Free T4: 0.76 ng/dL (ref 0.60–1.60)

## 2017-09-06 ENCOUNTER — Encounter: Payer: Self-pay | Admitting: Endocrinology

## 2017-09-06 ENCOUNTER — Ambulatory Visit (INDEPENDENT_AMBULATORY_CARE_PROVIDER_SITE_OTHER): Payer: No Typology Code available for payment source | Admitting: Endocrinology

## 2017-09-06 VITALS — BP 128/78 | HR 62 | Temp 98.6°F | Wt 261.0 lb

## 2017-09-06 DIAGNOSIS — E890 Postprocedural hypothyroidism: Secondary | ICD-10-CM

## 2017-09-06 DIAGNOSIS — Z923 Personal history of irradiation: Secondary | ICD-10-CM | POA: Diagnosis not present

## 2017-09-06 NOTE — Progress Notes (Signed)
Patient ID: Christopher Howard, male   DOB: 1965/11/09, 52 y.o.   MRN: [id number]           Referring Physician: Alysia Penna  Reason for Appointment:  Hypothyroidism, new visit    History of Present Illness:   Hypothyroidism was first diagnosed in early 2013 after treatment for Graves' disease with radioactive iodine  He has been on thyroid supplementation since then and followed fairly regularly For several years he had been taking a dose of 100 g of levothyroxine This was increased in 3/18 up to 137 g, at that time his TSH was 3.1  Overall for the last few years a patient does complain of fatigue which is variable and he has had other medical problems ongoing also With changing the dose in March he did not feel any difference in his energy level Also felt better with starting CPAP treatment  His TSH in July was 0.18 when he was referred here for further management Subsequently his TSH level has been relatively low on his initial evaluation in 10/18 Since then he has been taking 125  levothyroxine  He does not think he has has as much fatigue since his last visit, does have some occasional shortness of breath He is very consistent with taking his thyroid supplement daily in the mornings         Patient's weight history is as follows:  Wt Readings from Last 3 Encounters:  09/06/17 261 lb (118.4 kg)  05/25/17 250 lb 12.8 oz (113.8 kg)  11/01/16 235 lb (106.6 kg)    Thyroid function results have been as follows:  Lab Results  Component Value Date   TSH 0.65 09/03/2017   TSH 0.15 (L) 05/28/2017   TSH 0.18 (L) 03/06/2017   TSH 3.12 11/01/2016   FREET4 0.76 09/03/2017   FREET4 1.22 05/28/2017   FREET4 0.81 03/06/2017   FREET4 0.77 11/01/2016   T3FREE 3.4 05/28/2017   T3FREE 3.2 03/06/2017   T3FREE 3.3 11/01/2016     Past Medical History:  Diagnosis Date  . Diffuse toxic goiter    had radioiodine ablation on 06-23-11  . GERD (gastroesophageal reflux disease)    pt  denies GERD  . Hyperlipidemia   . Hypothyroidism (acquired)    sees Dr. Loanne Drilling   . Pneumonia 12/2013  . Seasonal allergies     Past Surgical History:  Procedure Laterality Date  . COLONOSCOPY  06-26-14   per Dr. Fuller Plan, benign polyps, repeat in 10 yrs   . laser surgery mouth  07/2013 & 08/2013  . THYROIDECTOMY    . WISDOM TOOTH EXTRACTION      Family History  Problem Relation Age of Onset  . Throat cancer Father   . Heart attack Father   . Arthritis Other   . Cancer Other        colon  . Diabetes Other   . Kidney disease Other   . Stroke Other   . Heart disease Other   . Stroke Mother   . Diabetes Maternal Uncle   . Colon cancer Neg Hx   . Esophageal cancer Neg Hx   . Rectal cancer Neg Hx   . Stomach cancer Neg Hx   . Thyroid disease Neg Hx     Social History:  reports that he quit smoking about 5 years ago. His smoking use included cigarettes. He has a 5.00 pack-year smoking history. he has never used smokeless tobacco. He reports that he does not drink alcohol or use  drugs.  Allergies:  Allergies  Allergen Reactions  . Ketoconazole Other (See Comments)    Pain all over body    Allergies as of 09/06/2017      Reactions   Ketoconazole Other (See Comments)   Pain all over body      Medication List        Accurate as of 09/06/17  8:27 AM. Always use your most recent med list.          aspirin EC 81 MG tablet Take 1 tablet (81 mg total) by mouth daily.   levothyroxine 125 MCG tablet Commonly known as:  SYNTHROID, LEVOTHROID TAKE 1 TABLET (125 MCG TOTAL) BY MOUTH DAILY BEFORE BREAKFAST.   lisinopril-hydrochlorothiazide 10-12.5 MG tablet Commonly known as:  PRINZIDE,ZESTORETIC TAKE 1 TABLET BY MOUTH DAILY.          Review of Systems   He had evaluation for hypogonadism in 05/2017 and this was ruled out   He is concerned about his progressive weight gain, this has been a problem more in the last year or so but was more easily when he was smoking  until about 2014        Examination:    BP 128/78 (BP Location: Right Arm, Patient Position: Sitting, Cuff Size: Large)   Pulse 62   Temp 98.6 F (37 C) (Oral)   Wt 261 lb (118.4 kg)   SpO2 94%   BMI 38.82 kg/m     Assessment:  HYPOTHYROIDISM, post ablative His levothyroxine dosage was decreased down to 125 g in October 2018 It was felt that his fatigue was unrelated to his thyroid supplementation  Currently his TSH is back to normal with taking the 120, gram daily He does feel fairly good with his energy level  However his main concern is his progressive weight gain   PLAN:   He will stay on 125 g of levothyroxine  Discussed that his weight gain is unrelated to his thyroid He is just starting to watch his diet and keeping an eye on his caloric intake with record keeping He has been fairly good with exercise  He will discuss his obesity with his PCP and recommended that he may need a weight loss medication if he is unable to lose weight on his own  Elayne Snare 09/06/2017, 8:27 AM     Note: This office note was prepared with Dragon voice recognition system technology. Any transcriptional errors that result from this process are unintentional.

## 2017-09-27 MED FILL — LEVOTHYROXINE 125 MCG TAB: 125 | 30 days supply | Qty: 30 | Fill #2

## 2017-10-26 ENCOUNTER — Other Ambulatory Visit: Payer: Self-pay | Admitting: Endocrinology

## 2017-10-26 MED FILL — LISINOPRIL-HCTZ 10-12.5 MG: 10-12.5 | 90 days supply | Qty: 90 | Fill #1 | Status: TO

## 2017-10-26 MED FILL — LEVOTHYROXINE 125 MCG TAB: 125 | 90 days supply | Qty: 90 | Fill #0

## 2018-01-29 ENCOUNTER — Other Ambulatory Visit: Payer: Self-pay | Admitting: Endocrinology

## 2018-01-29 MED FILL — LISINOPRIL-HCTZ 10-12.5 MG: 10-12.5 | 90 days supply | Qty: 90 | Fill #2 | Status: TO

## 2018-01-29 MED FILL — LEVOTHYROXINE 125 MCG TAB: 125 | 30 days supply | Qty: 30 | Fill #0

## 2018-02-11 ENCOUNTER — Ambulatory Visit (INDEPENDENT_AMBULATORY_CARE_PROVIDER_SITE_OTHER): Payer: No Typology Code available for payment source | Admitting: Family Medicine

## 2018-02-11 ENCOUNTER — Encounter: Payer: Self-pay | Admitting: Family Medicine

## 2018-02-11 VITALS — BP 100/70 | HR 57 | Temp 98.5°F | Ht 68.75 in | Wt 254.8 lb

## 2018-02-11 DIAGNOSIS — E890 Postprocedural hypothyroidism: Secondary | ICD-10-CM | POA: Diagnosis not present

## 2018-02-11 DIAGNOSIS — Z0000 Encounter for general adult medical examination without abnormal findings: Secondary | ICD-10-CM

## 2018-02-11 LAB — POC URINALSYSI DIPSTICK (AUTOMATED)
Bilirubin, UA: NEGATIVE
GLUCOSE UA: NEGATIVE
Ketones, UA: NEGATIVE
Leukocytes, UA: NEGATIVE
Nitrite, UA: NEGATIVE
Protein, UA: NEGATIVE
RBC UA: NEGATIVE
SPEC GRAV UA: 1.025 (ref 1.010–1.025)
Urobilinogen, UA: 0.2 E.U./dL
pH, UA: 5.5 (ref 5.0–8.0)

## 2018-02-11 MED ORDER — KETOCONAZOLE 2 % EX CREA: 1.0000 "application " | TOPICAL_CREAM | Freq: Two times a day (BID) | CUTANEOUS | 2 refills | Status: DC

## 2018-02-11 NOTE — Progress Notes (Signed)
   Subjective:    Patient ID: Christopher Howard, male    DOB: March 10, 1966, 52 y.o.   MRN: [id number]  HPI Here for a well exam. He wants to talk about his weight. Since he stopped smoking a few years ago his weight has gone up, and he finds it very difficult to get it back off. He exercises and watches his diet. Of course he is being treated for hypothyroidism by Dr. Dwyane Dee and this likely plays a role. He uses his CPAP machine every night. He does note a dry tickling cough in the back of the throat that started at least 6 months ago. His BP has been well controlled and in fact is often a bit low. He gets values of 211-941 systolic most of the time.    Review of Systems  Constitutional: Negative.   HENT: Negative.   Eyes: Negative.   Respiratory: Positive for cough.   Cardiovascular: Negative.   Gastrointestinal: Negative.   Genitourinary: Negative.   Musculoskeletal: Negative.   Skin: Negative.   Neurological: Negative.   Psychiatric/Behavioral: Negative.        Objective:   Physical Exam  Constitutional: He is oriented to person, place, and time. No distress.  Obese   HENT:  Head: Normocephalic and atraumatic.  Right Ear: External ear normal.  Left Ear: External ear normal.  Nose: Nose normal.  Mouth/Throat: Oropharynx is clear and moist. No oropharyngeal exudate.  Eyes: Pupils are equal, round, and reactive to light. Conjunctivae and EOM are normal. Right eye exhibits no discharge. Left eye exhibits no discharge. No scleral icterus.  Neck: Neck supple. No JVD present. No tracheal deviation present. No thyromegaly present.  Cardiovascular: Normal rate, regular rhythm, normal heart sounds and intact distal pulses. Exam reveals no gallop and no friction rub.  No murmur heard. Pulmonary/Chest: Effort normal and breath sounds normal. No respiratory distress. He has no wheezes. He has no rales. He exhibits no tenderness.  Abdominal: Soft. Bowel sounds are normal. He exhibits no  distension and no mass. There is no tenderness. There is no rebound and no guarding.  Genitourinary: Rectum normal, prostate normal and penis normal. Rectal exam shows guaiac negative stool. No penile tenderness.  Musculoskeletal: Normal range of motion. He exhibits no edema or tenderness.  Lymphadenopathy:    He has no cervical adenopathy.  Neurological: He is alert and oriented to person, place, and time. He has normal reflexes. He displays normal reflexes. No cranial nerve deficit. He exhibits normal muscle tone. Coordination normal.  Skin: Skin is warm and dry. No rash noted. He is not diaphoretic. No erythema. No pallor.  Psychiatric: He has a normal mood and affect. His behavior is normal. Judgment and thought content normal.          Assessment & Plan:  Well exam. We discussed diet and exercise. Get fasting labs today. His BP is overmedicated so we agreed to stop the Lisinopril HCT. He will monitor this closely at home. Recheck here in 90 days. His chronic cough is likely related to the Lisinopril, so hopefully this will resolve in the next week or two.  Alysia Penna, MD

## 2018-02-12 LAB — CBC WITH DIFFERENTIAL/PLATELET
BASOS ABS: 0.1 10*3/uL (ref 0.0–0.1)
BASOS PCT: 1.2 % (ref 0.0–3.0)
EOS PCT: 5.9 % — AB (ref 0.0–5.0)
Eosinophils Absolute: 0.3 10*3/uL (ref 0.0–0.7)
HEMATOCRIT: 40.4 % (ref 39.0–52.0)
Hemoglobin: 13.4 g/dL (ref 13.0–17.0)
LYMPHS ABS: 1.4 10*3/uL (ref 0.7–4.0)
Lymphocytes Relative: 31.2 % (ref 12.0–46.0)
MCHC: 33.1 g/dL (ref 30.0–36.0)
MCV: 84.8 fl (ref 78.0–100.0)
MONOS PCT: 10.3 % (ref 3.0–12.0)
Monocytes Absolute: 0.5 10*3/uL (ref 0.1–1.0)
NEUTROS ABS: 2.3 10*3/uL (ref 1.4–7.7)
NEUTROS PCT: 51.4 % (ref 43.0–77.0)
PLATELETS: 198 10*3/uL (ref 150.0–400.0)
RBC: 4.76 Mil/uL (ref 4.22–5.81)
RDW: 14.6 % (ref 11.5–15.5)
WBC: 4.5 10*3/uL (ref 4.0–10.5)

## 2018-02-12 LAB — HEPATIC FUNCTION PANEL
ALBUMIN: 4.2 g/dL (ref 3.5–5.2)
ALT: 58 U/L — AB (ref 0–53)
AST: 39 U/L — AB (ref 0–37)
Alkaline Phosphatase: 80 U/L (ref 39–117)
BILIRUBIN TOTAL: 0.4 mg/dL (ref 0.2–1.2)
Bilirubin, Direct: 0.1 mg/dL (ref 0.0–0.3)
Total Protein: 7.2 g/dL (ref 6.0–8.3)

## 2018-02-12 LAB — LIPID PANEL
CHOL/HDL RATIO: 4
Cholesterol: 194 mg/dL (ref 0–200)
HDL: 49.6 mg/dL (ref >39.00)
LDL Cholesterol: 128 mg/dL — ABNORMAL HIGH (ref 0–99)
NONHDL: 144.34
TRIGLYCERIDES: 83 mg/dL (ref 0.0–149.0)
VLDL: 16.6 mg/dL (ref 0.0–40.0)

## 2018-02-12 LAB — BASIC METABOLIC PANEL
BUN: 21 mg/dL (ref 6–23)
CALCIUM: 9.2 mg/dL (ref 8.4–10.5)
CHLORIDE: 102 meq/L (ref 96–112)
CO2: 26 mEq/L (ref 19–32)
CREATININE: 1.3 mg/dL (ref 0.40–1.50)
GFR: 73.87 mL/min (ref >60.00)
Glucose, Bld: 84 mg/dL (ref 70–99)
Potassium: 4.3 mEq/L (ref 3.5–5.1)
Sodium: 136 mEq/L (ref 135–145)

## 2018-02-12 LAB — PSA: PSA: 3.46 ng/mL (ref 0.10–4.00)

## 2018-02-12 LAB — TSH: TSH: 0.45 u[IU]/mL (ref 0.35–4.50)

## 2018-02-12 LAB — T3, FREE: T3, Free: 3.3 pg/mL (ref 2.3–4.2)

## 2018-02-12 LAB — T4, FREE: Free T4: 0.86 ng/dL (ref 0.60–1.60)

## 2018-02-18 ENCOUNTER — Encounter: Payer: Self-pay | Admitting: Family Medicine

## 2018-02-18 ENCOUNTER — Telehealth: Payer: Self-pay | Admitting: Endocrinology

## 2018-02-18 ENCOUNTER — Other Ambulatory Visit: Payer: Self-pay

## 2018-02-18 ENCOUNTER — Encounter: Payer: Self-pay | Admitting: Endocrinology

## 2018-02-18 DIAGNOSIS — I10 Essential (primary) hypertension: Secondary | ICD-10-CM

## 2018-02-18 DIAGNOSIS — E782 Mixed hyperlipidemia: Secondary | ICD-10-CM

## 2018-02-18 MED ORDER — LEVOTHYROXINE SODIUM 125 MCG PO TABS: ORAL_TABLET | ORAL | 11 refills | Status: DC

## 2018-02-18 NOTE — Telephone Encounter (Signed)
Pt called and notified of MD message. Pt verbalized understanding. Rx updated in pt chart.

## 2018-02-18 NOTE — Telephone Encounter (Signed)
Correction: Half tablet on Sundays

## 2018-02-18 NOTE — Telephone Encounter (Signed)
Please advise 

## 2018-02-18 NOTE — Telephone Encounter (Signed)
He can come back annually but his labs show thyroid to be on the high normal side.  With the same dosage of 125 mcg he will need to take 1/2 tablet less on some days.  Can refill

## 2018-02-18 NOTE — Telephone Encounter (Signed)
Please specify what days he needs to take only a half tab.

## 2018-02-18 NOTE — Telephone Encounter (Signed)
levothyroxine (SYNTHROID, LEVOTHROID) 125 MCG tablet   Called patient to schedule his follow up from his mychart message, Last visit said come back in 1 year with labs. Patient stated he believed that he wanted him back in august. He just had labs done at Dr Barbie Banner office yesterday and stated that if it was okay to schedule for one year he wanted to do that but wanted to make sure he was able to get refills on medication     [place], [place]

## 2018-02-22 NOTE — Telephone Encounter (Signed)
The referral was done  

## 2018-03-06 ENCOUNTER — Encounter: Payer: Self-pay | Admitting: Family Medicine

## 2018-03-06 ENCOUNTER — Ambulatory Visit (INDEPENDENT_AMBULATORY_CARE_PROVIDER_SITE_OTHER): Payer: No Typology Code available for payment source | Admitting: Family Medicine

## 2018-03-06 VITALS — BP 136/90 | HR 61 | Temp 98.3°F | Ht 68.75 in | Wt 259.8 lb

## 2018-03-06 DIAGNOSIS — I10 Essential (primary) hypertension: Secondary | ICD-10-CM

## 2018-03-06 DIAGNOSIS — G4733 Obstructive sleep apnea (adult) (pediatric): Secondary | ICD-10-CM

## 2018-03-06 MED ORDER — HYDROCHLOROTHIAZIDE 25 MG PO TABS: 25.0000 mg | ORAL_TABLET | Freq: Every day | ORAL | 3 refills | Status: DC

## 2018-03-06 MED FILL — HYDROCHLOROTHIAZIDE 25 MG T: 25 | 90 days supply | Qty: 90 | Fill #0

## 2018-03-06 MED FILL — LEVOTHYROXINE 125 MCG TAB: 125 | 90 days supply | Qty: 84 | Fill #0

## 2018-03-06 NOTE — Progress Notes (Signed)
   Subjective:    Patient ID: Christopher Howard, male    DOB: 1966-03-13, 52 y.o.   MRN: [id number]  HPI Here to follow up on his BP. At our last visit we stopped the Lisinopril HCT and since then his BP has crept back up a bit. He has been getting readings in the 130s over 90s. He feels fine. He also mentions that the mask on his CPAP no longer fits well. He used to see Dr. Gwenette Greet.    Review of Systems  Constitutional: Negative.   Respiratory: Negative.   Cardiovascular: Negative.   Neurological: Negative.        Objective:   Physical Exam  Constitutional: He appears well-developed and well-nourished.  Cardiovascular: Normal rate, regular rhythm, normal heart sounds and intact distal pulses.  Pulmonary/Chest: Effort normal and breath sounds normal. No stridor. No respiratory distress. He has no wheezes. He has no rales.  Musculoskeletal: He exhibits no edema.          Assessment & Plan:  For the HTN, we will start him on HCTZ 25 mg daily. For the sleep apnea, we will refer him back to Pulmonary to meet with a new provider . Alysia Penna, MD

## 2018-03-07 ENCOUNTER — Encounter: Payer: No Typology Code available for payment source | Attending: Family Medicine | Admitting: Dietician

## 2018-03-07 ENCOUNTER — Encounter: Payer: Self-pay | Admitting: Dietician

## 2018-03-07 DIAGNOSIS — I10 Essential (primary) hypertension: Secondary | ICD-10-CM | POA: Insufficient documentation

## 2018-03-07 DIAGNOSIS — Z713 Dietary counseling and surveillance: Secondary | ICD-10-CM | POA: Insufficient documentation

## 2018-03-07 DIAGNOSIS — E782 Mixed hyperlipidemia: Secondary | ICD-10-CM | POA: Insufficient documentation

## 2018-03-07 NOTE — Patient Instructions (Addendum)
Consider adding fruit to your protein shake in the am. Fiber goal = 35 grams per day.  Beans are an excellent source of fiber  Add berries  Double veges Meat portion the size of the palm of your hand Adequate hydration Look at how much creamer you are using.  How many calories? Natural peanut butter. Continue your active lifestyle. Adequate sleep

## 2018-03-07 NOTE — Progress Notes (Signed)
  Medical Nutrition Therapy:  Appt start time: 2637 end time:  1630. Patient arrived to the wrong office.  Assessment:  Primary concerns today: Patient is here today alone.  How to lose weight and how to curb appetite.  He struggles with constipation.  He will try something (diet or probiotic) and he will lose weight or become less constipated but then states that his body adjusts and he will stop losing or become constipated. He states that his sleep is better since buying a new mattress.  He generally sleeps 6-8 hours.  He tracks his intake using apps in his phone. History includes HTN, hyperlipidemia, GERD, OSA.  Thyroid removed.  Weight hx: 259 lbs today Would like to lose to 230 lbs. Gained 30 lbs when he stopped smoking about 6-7 years ago and has been unable to lose it.  His thyroid had problems at about the same time.  Patient lives with his wife and 2 adult children.  He used to work nights then second and for the past 25 years he has worked first shift but is on call at night.  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 Kitchen in his office and prepares his meals at work.  He and his wife share cooking and do meal prep as well on Sunday.  Preferred Learning Style:   No preference indicated   Learning Readiness:   Ready  Change in progress   MEDICATIONS: see list   DIETARY INTAKE:  24-hr recall:  B ( AM): Protein, fiber, vitamin shake OR sausage, egg, [employer] bread Snk ( AM):   L ( PM):  [place] Frozen vegetables, chicken prepared in an air fryer.  OR sandwich on sandwich thins or wrap or Flat out OR fast food Saturday when he is on his job.OR out to eat on Sunday ([employer]) Snk ( PM): PB and crackers or salami cheese and crackers, banana D ( PM): grilled chicken breast or hamburger, vegetables, occasional potatoes or rice or macaroni salad OR salad with "a lot of dressing" and stopped eating  bread at dinner Snk ( PM): meat or crackers and cheese and rare candy ([employer]), occasional fruit Beverages: water, carbonated water, 2 cups coffee with creamer  Usual physical activity: 2 1/2 miles walking 5 days per week and works on cars, [employer] the [employer] needs: 2000 calories 100 g protein  Progress Towards Goal(s):  In progress.   Nutritional Diagnosis:  NB-1.1 Food and nutrition-related knowledge deficit As related to general nutrition.  As evidenced by patient report.    Intervention:  Nutrition counseling/education related to HTN, hyperlipidemia, general nutrition.    Consider adding fruit to your protein shake in the am. Fiber goal = 35 grams per day.  Beans are an excellent source of fiber  Add berries  Double veges Meat portion the size of the palm of your hand Adequate hydration Look at how much creamer you are using.  How many calories? Natural peanut butter. Continue your active lifestyle. Adequate sleep  Teaching Method Utilized:  Visual Auditory Hands on  Handouts given during visit include:  Eating out tips  Meal plan card  Snack list  Cholesterol and triglycerides  DASH diet  Mediterranean diet  Barriers to learning/adherence to lifestyle change: none  Demonstrated degree of understanding via:  Teach Back   Monitoring/Evaluation:  Dietary intake, exercise, and body weight prn.

## 2018-04-26 ENCOUNTER — Ambulatory Visit (INDEPENDENT_AMBULATORY_CARE_PROVIDER_SITE_OTHER): Payer: No Typology Code available for payment source | Admitting: Pulmonary Disease

## 2018-04-26 ENCOUNTER — Encounter: Payer: Self-pay | Admitting: Pulmonary Disease

## 2018-04-26 VITALS — BP 128/72 | HR 66 | Ht 69.88 in | Wt 263.2 lb

## 2018-04-26 DIAGNOSIS — G4733 Obstructive sleep apnea (adult) (pediatric): Secondary | ICD-10-CM

## 2018-04-26 DIAGNOSIS — Z9989 Dependence on other enabling machines and devices: Secondary | ICD-10-CM | POA: Diagnosis not present

## 2018-04-26 DIAGNOSIS — E669 Obesity, unspecified: Secondary | ICD-10-CM | POA: Diagnosis not present

## 2018-04-26 DIAGNOSIS — G4730 Sleep apnea, unspecified: Secondary | ICD-10-CM | POA: Diagnosis not present

## 2018-04-26 NOTE — Patient Instructions (Signed)
Will arrange for new CPAP mask and supplies  Follow up in 1 year

## 2018-04-26 NOTE — Progress Notes (Signed)
[facility]  Chief Complaint  Patient presents with  . Follow-up    has CPAP, needs mask changes    Constitutional: BP 128/72 (BP Location: Left Arm, Patient Position: Sitting, Cuff Size: Normal)   Pulse 66   Ht 5' 9.88" (1.775 m)   Wt 263 lb 3.2 oz (119.4 kg)   SpO2 94%   BMI 37.89 kg/m   History of Present Illness: Christopher Howard is a 52 y.o. male with obstructive sleep apnea.  He was previously seen by Dr. Gwenette Greet.  He had home sleep study from March 2016 that showed severe obstructive sleep apnea.  He has been on CPAP since.  At that time his weight was 240 lbs.  He has gained weight since then.  He was using [employer] for his DME.  Hasn't been in contact with them recently.  He is in need of new supplies.  His mask leaks some.  He has been getting dry eyes and dry mouth at night.  He goes to sleep at 11 pm.  He falls asleep quickly.  He wakes up some times to use the bathroom.  He gets out of bed at at different times.  He feels okay in the morning.  He denies morning headache.  He does not use anything to help him fall sleep or stay awake.  He denies sleep walking, sleep talking, bruxism, or nightmares.  There is no history of restless legs.  He denies sleep hallucinations, sleep paralysis, or cataplexy.  The Epworth score is 4 out of 24.   Comprehensive Respiratory Exam:  Appearance - well kempt  ENMT - nasal mucosa moist, turbinates clear, midline nasal septum, no dental lesions, no gingival bleeding, no oral exudates, no tonsillar hypertrophy, enlarged tongue, MP 3 Neck - no masses, trachea midline, no thyromegaly, no elevation in JVP Respiratory - normal appearance of chest wall, normal respiratory effort w/o accessory muscle use, no dullness on percussion, no wheezing or rales CV - s1s2 regular rate and rhythm, no murmurs, no peripheral edema, radial pulses symmetric GI - soft, non tender, no masses Lymph - no adenopathy noted  in neck and axillary areas MSK - normal muscle strength and tone, normal gait Ext - no cyanosis, clubbing, or joint inflammation noted Skin - no rashes, lesions, or ulcers Neuro - oriented to person, place, and time Psych - normal mood and affect  Labs: CMP Latest Ref Rng & Units 02/11/2018 05/28/2017 10/30/2016  Glucose 70 - 99 mg/dL 84 91 95  BUN 6 - 23 mg/dL 21 - 19  Creatinine 0.40 - 1.50 mg/dL 1.30 - 1.38  Sodium 135 - 145 mEq/L 136 - 139  Potassium 3.5 - 5.1 mEq/L 4.3 - 4.3  Chloride 96 - 112 mEq/L 102 - 106  CO2 19 - 32 mEq/L 26 - 27  Calcium 8.4 - 10.5 mg/dL 9.2 - 9.6  Total Protein 6.0 - 8.3 g/dL 7.2 - 7.1  Total Bilirubin 0.2 - 1.2 mg/dL 0.4 - 0.4  Alkaline Phos 39 - 117 U/L 80 - 78  AST 0 - 37 U/L 39(H) - 15  ALT 0 - 53 U/L 58(H) - 12    CBC Latest Ref Rng & Units 02/11/2018 05/28/2017 10/30/2016  WBC 4.0 - 10.5 K/uL 4.5 3.4(L) 4.3  Hemoglobin 13.0 - 17.0 g/dL 13.4 13.4 14.3  Hematocrit 39.0 - 52.0 % 40.4 40.9 42.8  Platelets 150.0 - 400.0 K/uL 198.0 193.0 174.0    Lab Results  Component Value  Date   TSH 0.45 02/11/2018     Assessment/Plan:  Obstructive sleep apnea. - he is compliant with CPAP and reports benefit - will arrange for new CPAP mask and supplies - continue auto CPAP - discussed options to assist with mask fit  Obesity. - discussed importance of weight loss - therapy options include diet modification and exercise   Patient Instructions  Will arrange for new CPAP mask and supplies  Follow up in 1 year    Chesley Mires, MD [place] 04/26/2018, 12:02 PM  Flow Sheet  Sleep tests: HST 11/18/14 >> AHI 30, SpO2 low 70%  Review of Systems: Constitutional: Positive for unexpected weight change. Negative for fever.  HENT: Positive for postnasal drip, sinus pressure and sneezing. Negative for congestion, dental problem, ear pain, nosebleeds, rhinorrhea, sore throat and trouble swallowing.   Eyes: Positive for itching.  Negative for redness.  Respiratory: Positive for cough and shortness of breath. Negative for chest tightness and wheezing.   Cardiovascular: Positive for leg swelling. Negative for palpitations.  Gastrointestinal: Negative for nausea and vomiting.  Genitourinary: Negative for dysuria.  Musculoskeletal: Negative for joint swelling.  Skin: Negative for rash.  Allergic/Immunologic: Positive for environmental allergies. Negative for food allergies and immunocompromised state.  Neurological: Positive for headaches.  Hematological: Bruises/bleeds easily.  Psychiatric/Behavioral: Negative for dysphoric mood. The patient is not nervous/anxious.    Past Medical History: He  has a past medical history of Diffuse toxic goiter, GERD (gastroesophageal reflux disease), Hyperlipidemia, Hypertension, Hypothyroidism (acquired), Pneumonia (12/2013), and Seasonal allergies.  Past Surgical History: He  has a past surgical history that includes Thyroidectomy; Wisdom tooth extraction; laser surgery mouth (07/2013 & 08/2013); and Colonoscopy (06-26-14).  Family History: His family history includes Arthritis in his other; Cancer in his other; Diabetes in his maternal uncle and other; Heart attack in his father; Heart disease in his other; Kidney disease in his other; Stroke in his mother and other; Throat cancer in his father.  Social History: He  reports that he quit smoking about 5 years ago. His smoking use included cigarettes. He has a 5.00 pack-year smoking history. He has never used smokeless tobacco. He reports that he does not drink alcohol or use drugs.  Medications: Allergies as of 04/26/2018      Reactions   Ketoconazole Other (See Comments)   Pain all over body      Medication List        Accurate as of 04/26/18 12:02 PM. Always use your most recent med list.          aspirin EC 81 MG tablet Take 1 tablet (81 mg total) by mouth daily.   hydrochlorothiazide 25 MG tablet Commonly known as:   HYDRODIURIL Take 1 tablet (25 mg total) by mouth daily.   levothyroxine 125 MCG tablet Commonly known as:  SYNTHROID, LEVOTHROID Take 1 tablet Monday-Saturday and on Sunday take 1/2 tablet.

## 2018-04-26 NOTE — Progress Notes (Signed)
   Subjective:    Patient ID: Christopher Howard, male    DOB: 1966/04/30, 52 y.o.   MRN: [id number]  HPI    Review of Systems  Constitutional: Positive for unexpected weight change. Negative for fever.  HENT: Positive for postnasal drip, sinus pressure and sneezing. Negative for congestion, dental problem, ear pain, nosebleeds, rhinorrhea, sore throat and trouble swallowing.   Eyes: Positive for itching. Negative for redness.  Respiratory: Positive for cough and shortness of breath. Negative for chest tightness and wheezing.   Cardiovascular: Positive for leg swelling. Negative for palpitations.  Gastrointestinal: Negative for nausea and vomiting.  Genitourinary: Negative for dysuria.  Musculoskeletal: Negative for joint swelling.  Skin: Negative for rash.  Allergic/Immunologic: Positive for environmental allergies. Negative for food allergies and immunocompromised state.  Neurological: Positive for headaches.  Hematological: Bruises/bleeds easily.  Psychiatric/Behavioral: Negative for dysphoric mood. The patient is not nervous/anxious.        Objective:   Physical Exam        Assessment & Plan:

## 2018-06-10 MED FILL — LEVOTHYROXINE 125 MCG TAB: 125 | 90 days supply | Qty: 84 | Fill #1

## 2018-06-10 MED FILL — HYDROCHLOROTHIAZIDE 25 MG T: 25 | 90 days supply | Qty: 90 | Fill #1

## 2018-09-11 MED FILL — HYDROCHLOROTHIAZIDE 25 MG T: 25 | 90 days supply | Qty: 90 | Fill #2

## 2018-09-11 MED FILL — LEVOTHYROXINE 125 MCG TAB: 125 | 90 days supply | Qty: 84 | Fill #2

## 2018-12-04 MED FILL — HYDROCHLOROTHIAZIDE 25 MG T: 25 | 90 days supply | Qty: 90 | Fill #0

## 2018-12-04 MED FILL — LEVOTHYROXINE 125 MCG TAB: 125 | 64 days supply | Qty: 60 | Fill #0

## 2018-12-05 ENCOUNTER — Encounter: Payer: Self-pay | Admitting: Family Medicine

## 2018-12-05 ENCOUNTER — Ambulatory Visit (INDEPENDENT_AMBULATORY_CARE_PROVIDER_SITE_OTHER): Payer: No Typology Code available for payment source | Admitting: Family Medicine

## 2018-12-05 ENCOUNTER — Other Ambulatory Visit: Payer: Self-pay

## 2018-12-05 DIAGNOSIS — E890 Postprocedural hypothyroidism: Secondary | ICD-10-CM

## 2018-12-05 DIAGNOSIS — R972 Elevated prostate specific antigen [PSA]: Secondary | ICD-10-CM

## 2018-12-05 NOTE — Addendum Note (Signed)
Addended by: Elmer Picker on: 12/05/2018 03:43 PM   Modules accepted: Orders

## 2018-12-05 NOTE — Progress Notes (Signed)
Subjective:    Patient ID: Christopher Howard, male    DOB: 09-May-1966, 53 y.o.   MRN: [id number]  HPI Virtual Visit via Video Note  I connected with the patient on 12/05/18 at  9:45 AM EDT by a video enabled telemedicine application and verified that I am speaking with the correct person using two identifiers.  Location patient: home Location provider:work or home office Persons participating in the virtual visit: patient, provider  I discussed the limitations of evaluation and management by telemedicine and the availability of in person appointments. The patient expressed understanding and agreed to proceed.   HPI: Here to discuss his concerns about weight gain. Over the past 2 years he has been very conscious about his diet and his exercise because he is slowly gaining weight. He thinks his diet is very healthy and he limits his daily caloric intake to 2000-2500. He exercises every day, usually by walking 4 miles at a fast pace. Despite this he cannot lose weight, and he thinks his thyroid levels are the reason. He sees Dr. Dwyane Dee for this, and the last thyroid panel he had drawn in June 2019 was all in the normal range. Also in June we noticed his PSA had jumped from 1.40 to 3.46 in one year, and I asked that we repeat this in 6 months. This was not done.    ROS: See pertinent positives and negatives per HPI.  Past Medical History:  Diagnosis Date  . Diffuse toxic goiter    had radioiodine ablation on 06-23-11  . GERD (gastroesophageal reflux disease)    pt denies GERD  . Hyperlipidemia   . Hypertension   . Hypothyroidism (acquired)    sees Dr. Loanne Drilling   . Pneumonia 12/2013  . Seasonal allergies     Past Surgical History:  Procedure Laterality Date  . COLONOSCOPY  06-26-14   per Dr. Fuller Plan, benign polyps, repeat in 10 yrs   . laser surgery mouth  07/2013 & 08/2013  . THYROIDECTOMY    . WISDOM TOOTH EXTRACTION      Family History  Problem Relation Age of Onset  . Throat  cancer Father   . Heart attack Father   . Arthritis Other   . Cancer Other        colon  . Diabetes Other   . Kidney disease Other   . Stroke Other   . Heart disease Other   . Stroke Mother   . Diabetes Maternal Uncle   . Colon cancer Neg Hx   . Esophageal cancer Neg Hx   . Rectal cancer Neg Hx   . Stomach cancer Neg Hx   . Thyroid disease Neg Hx      Current Outpatient Medications:  .  aspirin EC 81 MG tablet, Take 1 tablet (81 mg total) by mouth daily. (Patient taking differently: Take 81 mg by mouth daily as needed. ), Disp: 1 tablet, Rfl: 0 .  hydrochlorothiazide (HYDRODIURIL) 25 MG tablet, Take 1 tablet (25 mg total) by mouth daily., Disp: 90 tablet, Rfl: 3 .  levothyroxine (SYNTHROID, LEVOTHROID) 125 MCG tablet, Take 1 tablet Monday-Saturday and on Sunday take 1/2 tablet., Disp: 30 tablet, Rfl: 11  EXAM:  VITALS per patient if applicable:  GENERAL: alert, oriented, appears well and in no acute distress  HEENT: atraumatic, conjunttiva clear, no obvious abnormalities on inspection of external nose and ears  NECK: normal movements of the head and neck  LUNGS: on inspection no signs of respiratory distress, breathing  rate appears normal, no obvious gross SOB, gasping or wheezing  CV: no obvious cyanosis  MS: moves all visible extremities without noticeable abnormality  PSYCH/NEURO: pleasant and cooperative, no obvious depression or anxiety, speech and thought processing grossly intact  ASSESSMENT AND PLAN: He is gaining weight and he thinks his thyroid medication is not at the proper dose. We will have him come by the labs to get another thyroid panel. We will also get another PSA to follow up.  Alysia Penna, MD  Discussed the following assessment and plan:  Hypothyroidism, postablative - Plan: TSH, T3, free, T4, free  Elevated PSA - Plan: PSA     I discussed the assessment and treatment plan with the patient. The patient was provided an opportunity to ask  questions and all were answered. The patient agreed with the plan and demonstrated an understanding of the instructions.   The patient was advised to call back or seek an in-person evaluation if the symptoms worsen or if the condition fails to improve as anticipated.      Review of Systems     Objective:   Physical Exam        Assessment & Plan:

## 2018-12-06 LAB — TSH: TSH: 0.78 u[IU]/mL (ref 0.35–4.50)

## 2018-12-06 LAB — PSA: PSA: 1.12 ng/mL (ref 0.10–4.00)

## 2018-12-06 LAB — T3, FREE: T3, Free: 2.8 pg/mL (ref 2.3–4.2)

## 2018-12-06 LAB — T4, FREE: Free T4: 0.82 ng/dL (ref 0.60–1.60)

## 2018-12-10 ENCOUNTER — Encounter: Payer: Self-pay | Admitting: Family Medicine

## 2018-12-11 MED ORDER — LEVOTHYROXINE SODIUM 137 MCG PO TABS: 137.0000 ug | ORAL_TABLET | Freq: Every day | ORAL | 3 refills | Status: DC

## 2018-12-11 MED FILL — LEVOTHYROXINE 137 MCG TABLE: 137 | 90 days supply | Qty: 90 | Fill #0

## 2018-12-19 ENCOUNTER — Encounter: Payer: Self-pay | Admitting: *Deleted

## 2018-12-31 ENCOUNTER — Encounter: Payer: Self-pay | Admitting: *Deleted

## 2019-02-14 ENCOUNTER — Telehealth: Payer: Self-pay | Admitting: Family Medicine

## 2019-02-14 DIAGNOSIS — Z0000 Encounter for general adult medical examination without abnormal findings: Secondary | ICD-10-CM

## 2019-02-14 DIAGNOSIS — R739 Hyperglycemia, unspecified: Secondary | ICD-10-CM

## 2019-02-14 DIAGNOSIS — E890 Postprocedural hypothyroidism: Secondary | ICD-10-CM

## 2019-02-14 NOTE — Telephone Encounter (Signed)
May I have lab orders for pts CPE he would like to have it done before his CPE.

## 2019-02-14 NOTE — Telephone Encounter (Signed)
Pt has been scheduled for labs 8/5/ at 8:00

## 2019-02-14 NOTE — Telephone Encounter (Signed)
Dr. Fry please advise. Thanks  

## 2019-02-14 NOTE — Telephone Encounter (Signed)
The orders have been placed  

## 2019-03-13 ENCOUNTER — Other Ambulatory Visit: Payer: Self-pay | Admitting: Family Medicine

## 2019-03-13 MED FILL — HYDROCHLOROTHIAZIDE 25 MG T: 25 | 90 days supply | Qty: 90 | Fill #0

## 2019-03-13 MED FILL — LEVOTHYROXINE 137 MCG TAB: 137 | 90 days supply | Qty: 90 | Fill #0

## 2019-03-26 ENCOUNTER — Other Ambulatory Visit: Payer: Self-pay

## 2019-03-26 ENCOUNTER — Other Ambulatory Visit (INDEPENDENT_AMBULATORY_CARE_PROVIDER_SITE_OTHER): Payer: No Typology Code available for payment source

## 2019-03-26 DIAGNOSIS — E890 Postprocedural hypothyroidism: Secondary | ICD-10-CM | POA: Diagnosis not present

## 2019-03-26 DIAGNOSIS — Z0000 Encounter for general adult medical examination without abnormal findings: Secondary | ICD-10-CM | POA: Diagnosis not present

## 2019-03-26 DIAGNOSIS — R739 Hyperglycemia, unspecified: Secondary | ICD-10-CM | POA: Diagnosis not present

## 2019-03-26 LAB — LIPID PANEL
Cholesterol: 206 mg/dL — ABNORMAL HIGH (ref 0–200)
HDL: 47.7 mg/dL (ref >39.00)
LDL Cholesterol: 138 mg/dL — ABNORMAL HIGH (ref 0–99)
NonHDL: 158.52
Total CHOL/HDL Ratio: 4
Triglycerides: 102 mg/dL (ref 0.0–149.0)
VLDL: 20.4 mg/dL (ref 0.0–40.0)

## 2019-03-26 LAB — POC URINALSYSI DIPSTICK (AUTOMATED)
Bilirubin, UA: NEGATIVE
Blood, UA: NEGATIVE
Glucose, UA: NEGATIVE
Ketones, UA: NEGATIVE
Leukocytes, UA: NEGATIVE
Nitrite, UA: NEGATIVE
Protein, UA: NEGATIVE
Spec Grav, UA: 1.025 (ref 1.010–1.025)
Urobilinogen, UA: 0.2 E.U./dL
pH, UA: 5.5 (ref 5.0–8.0)

## 2019-03-26 LAB — BASIC METABOLIC PANEL
BUN: 24 mg/dL — ABNORMAL HIGH (ref 6–23)
CO2: 26 mEq/L (ref 19–32)
Calcium: 9.2 mg/dL (ref 8.4–10.5)
Chloride: 102 mEq/L (ref 96–112)
Creatinine, Ser: 1.59 mg/dL — ABNORMAL HIGH (ref 0.40–1.50)
GFR: 54.86 mL/min — ABNORMAL LOW (ref >60.00)
Glucose, Bld: 96 mg/dL (ref 70–99)
Potassium: 4.3 mEq/L (ref 3.5–5.1)
Sodium: 136 mEq/L (ref 135–145)

## 2019-03-26 LAB — CBC WITH DIFFERENTIAL/PLATELET
Basophils Absolute: 0.1 10*3/uL (ref 0.0–0.1)
Basophils Relative: 1.2 % (ref 0.0–3.0)
Eosinophils Absolute: 0.2 10*3/uL (ref 0.0–0.7)
Eosinophils Relative: 3.6 % (ref 0.0–5.0)
HCT: 42.2 % (ref 39.0–52.0)
Hemoglobin: 13.8 g/dL (ref 13.0–17.0)
Lymphocytes Relative: 33.9 % (ref 12.0–46.0)
Lymphs Abs: 1.5 10*3/uL (ref 0.7–4.0)
MCHC: 32.8 g/dL (ref 30.0–36.0)
MCV: 85.4 fl (ref 78.0–100.0)
Monocytes Absolute: 0.5 10*3/uL (ref 0.1–1.0)
Monocytes Relative: 11.6 % (ref 3.0–12.0)
Neutro Abs: 2.1 10*3/uL (ref 1.4–7.7)
Neutrophils Relative %: 49.7 % (ref 43.0–77.0)
Platelets: 198 10*3/uL (ref 150.0–400.0)
RBC: 4.94 Mil/uL (ref 4.22–5.81)
RDW: 14.6 % (ref 11.5–15.5)
WBC: 4.3 10*3/uL (ref 4.0–10.5)

## 2019-03-26 LAB — T4, FREE: Free T4: 1.06 ng/dL (ref 0.60–1.60)

## 2019-03-26 LAB — HEMOGLOBIN A1C: Hgb A1c MFr Bld: 6.3 % (ref 4.6–6.5)

## 2019-03-26 LAB — PSA: PSA: 1.41 ng/mL (ref 0.10–4.00)

## 2019-03-26 LAB — T3, FREE: T3, Free: 3.6 pg/mL (ref 2.3–4.2)

## 2019-03-26 LAB — HEPATIC FUNCTION PANEL
ALT: 16 U/L (ref 0–53)
AST: 15 U/L (ref 0–37)
Albumin: 4 g/dL (ref 3.5–5.2)
Alkaline Phosphatase: 68 U/L (ref 39–117)
Bilirubin, Direct: 0.1 mg/dL (ref 0.0–0.3)
Total Bilirubin: 0.5 mg/dL (ref 0.2–1.2)
Total Protein: 7.2 g/dL (ref 6.0–8.3)

## 2019-03-26 LAB — TSH: TSH: 0.02 u[IU]/mL — ABNORMAL LOW (ref 0.35–4.50)

## 2019-03-31 ENCOUNTER — Ambulatory Visit (INDEPENDENT_AMBULATORY_CARE_PROVIDER_SITE_OTHER): Payer: No Typology Code available for payment source | Admitting: Family Medicine

## 2019-03-31 ENCOUNTER — Other Ambulatory Visit: Payer: Self-pay

## 2019-03-31 ENCOUNTER — Encounter: Payer: Self-pay | Admitting: Family Medicine

## 2019-03-31 VITALS — BP 120/64 | HR 68 | Temp 98.9°F | Wt 272.2 lb

## 2019-03-31 DIAGNOSIS — Z0000 Encounter for general adult medical examination without abnormal findings: Secondary | ICD-10-CM

## 2019-03-31 MED ORDER — HYDROCHLOROTHIAZIDE 25 MG PO TABS: 25.0000 mg | ORAL_TABLET | Freq: Every day | ORAL | 3 refills | Status: DC

## 2019-03-31 NOTE — Progress Notes (Signed)
Subjective:    Patient ID: Christopher Howard, male    DOB: 1965-10-02, 52 y.o.   MRN: [id number]  HPI Here for a well exam. He feels well but he continues to be frustrated by his continued weight gain despite all his efforts. He only consumes 1500 calories a day and he walks 4-5 mile every day, but this does not seem to help much. His recent labs were fairly unremarkable. He tried Phentermine in the past with little success. He has been researching the OTC medication Alli (orlistat) and he asks if he could try this.    Review of Systems  Constitutional: Negative.   HENT: Negative.   Eyes: Negative.   Respiratory: Negative.   Cardiovascular: Negative.   Gastrointestinal: Negative.   Genitourinary: Negative.   Musculoskeletal: Negative.   Skin: Negative.   Neurological: Negative.   Psychiatric/Behavioral: Negative.        Objective:   Physical Exam Constitutional:      General: He is not in acute distress.    Appearance: He is well-developed. He is obese. He is not diaphoretic.  HENT:     Head: Normocephalic and atraumatic.     Right Ear: External ear normal.     Left Ear: External ear normal.     Nose: Nose normal.     Mouth/Throat:     Pharynx: No oropharyngeal exudate.  Eyes:     General: No scleral icterus.       Right eye: No discharge.        Left eye: No discharge.     Conjunctiva/sclera: Conjunctivae normal.     Pupils: Pupils are equal, round, and reactive to light.  Neck:     Musculoskeletal: Neck supple.     Thyroid: No thyromegaly.     Vascular: No JVD.     Trachea: No tracheal deviation.  Cardiovascular:     Rate and Rhythm: Normal rate and regular rhythm.     Heart sounds: Normal heart sounds. No murmur. No friction rub. No gallop.   Pulmonary:     Effort: Pulmonary effort is normal. No respiratory distress.     Breath sounds: Normal breath sounds. No wheezing or rales.  Chest:     Chest wall: No tenderness.  Abdominal:     General: Bowel sounds are  normal. There is no distension.     Palpations: Abdomen is soft. There is no mass.     Tenderness: There is no abdominal tenderness. There is no guarding or rebound.  Genitourinary:    Penis: Normal. No tenderness.      Prostate: Normal.     Rectum: Normal. Guaiac result negative.  Musculoskeletal: Normal range of motion.        General: No tenderness.  Lymphadenopathy:     Cervical: No cervical adenopathy.  Skin:    General: Skin is warm and dry.     Coloration: Skin is not pale.     Findings: No erythema or rash.  Neurological:     Mental Status: He is alert and oriented to person, place, and time.     Cranial Nerves: No cranial nerve deficit.     Motor: No abnormal muscle tone.     Coordination: Coordination normal.     Deep Tendon Reflexes: Reflexes are normal and symmetric. Reflexes normal.  Psychiatric:        Behavior: Behavior normal.        Thought Content: Thought content normal.  Judgment: Judgment normal.           Assessment & Plan:  Well exam. We discussed diet and exercise. I told him to go ahead and try the Gross. We will follow up with labs in 6 months.  Alysia Penna, MD

## 2019-06-09 MED FILL — HYDROCHLOROTHIAZIDE 25 MG T: 25 | 90 days supply | Qty: 90 | Fill #0

## 2019-06-09 MED FILL — LEVOTHYROXINE 137 MCG TAB: 137 | 90 days supply | Qty: 90 | Fill #1

## 2019-07-24 ENCOUNTER — Encounter: Payer: Self-pay | Admitting: Pulmonary Disease

## 2019-07-24 ENCOUNTER — Telehealth (INDEPENDENT_AMBULATORY_CARE_PROVIDER_SITE_OTHER): Payer: No Typology Code available for payment source | Admitting: Pulmonary Disease

## 2019-07-24 DIAGNOSIS — Z9989 Dependence on other enabling machines and devices: Secondary | ICD-10-CM | POA: Diagnosis not present

## 2019-07-24 DIAGNOSIS — G4733 Obstructive sleep apnea (adult) (pediatric): Secondary | ICD-10-CM

## 2019-07-24 NOTE — Progress Notes (Signed)
[facility]  Chief Complaint  Patient presents with  . Follow-up    obstructive sleep apnea    Constitutional:  There were no vitals taken for this visit.  Past Medical History:  Allergies, Pneumonia, Hypothyroidism, HTN, HLD, GERD  Brief Summary:  Christopher Howard is a 53 y.o. male with obstructive sleep apnea.  Virtual Visit via Video Note  I connected with Elisabeth Pigeon on 07/24/19 at  2:00 PM EST by a video enabled telemedicine application and verified that I am speaking with the correct person using two identifiers.  Location: Patient: home Provider: medical office   I discussed the limitations of evaluation and management by telemedicine and the availability of in person appointments. The patient expressed understanding and agreed to proceed.  He has been doing well with CPAP.  Tried nasal mask, but pressure felt too high in his sinuses.  Changed back to full face mask, and doing better.  Not having sinus congestion, sore throat, dry mouth, or aerophagia.   Has noticed weight gain.  Had thyroid medication adjusted by PCP.    Physical Exam:  Pleasant mood and demeanor.  No stridor.  No accessory muscle use.  Speaking in full sentences w/o difficulty.   Assessment/Plan:   Obstructive sleep apnea. - he is compliant with CPAP and reports benefit - continue auto CPAP   Patient Instructions  Follow up in 1 year    Chesley Mires, MD [place] Pager: [phone] 07/24/2019, 2:25 PM  Flow Sheet    Sleep tests:  HST 11/18/14 >> AHI 30, SpO2 low 70% Auto CPAP 06/23/19 o 07/23/19 >> used on 30 of 30 nights with average 7 hrs 43 min.  Average AHI 2.9 with 90% setting of 13 cm H2O.  Medications:   Allergies as of 07/24/2019      Reactions   Ketoconazole Other (See Comments)   Pain all over body      Medication List       Accurate as of July 24, 2019  2:25 PM. If you have any questions, ask  your nurse or doctor.        aspirin EC 81 MG tablet Take 1 tablet (81 mg total) by mouth daily. What changed:   when to take this  reasons to take this   hydrochlorothiazide 25 MG tablet Commonly known as: HYDRODIURIL Take 1 tablet (25 mg total) by mouth daily.   levothyroxine 137 MCG tablet Commonly known as: Synthroid Take 1 tablet (137 mcg total) by mouth daily before breakfast.       Past Surgical History:  He  has a past surgical history that includes Thyroidectomy; Wisdom tooth extraction; laser surgery mouth (07/2013 & 08/2013); and Colonoscopy (06-26-14).  Family History:  His family history includes Arthritis in an other family member; Cancer in an other family member; Diabetes in his maternal uncle and another family member; Heart attack in his father; Heart disease in an other family member; Kidney disease in an other family member; Stroke in his mother and another family member; Throat cancer in his father.  Social History:  He  reports that he quit smoking about 6 years ago. His smoking use included cigarettes. He has a 5.00 pack-year smoking history. He has never used smokeless tobacco. He reports that he does not drink alcohol or use drugs.

## 2019-07-24 NOTE — Patient Instructions (Signed)
Follow up in 1 year.

## 2019-08-29 MED FILL — PREVIDENT 1.1% GEL: 1.1 | 30 days supply | Qty: 56 | Fill #0

## 2019-09-17 MED FILL — LEVOTHYROXINE 137 MCG TAB: 137 | 90 days supply | Qty: 90 | Fill #2

## 2019-09-17 MED FILL — HYDROCHLOROTHIAZIDE 25 MG T: 25 | 90 days supply | Qty: 90 | Fill #1

## 2019-12-24 ENCOUNTER — Other Ambulatory Visit: Payer: Self-pay | Admitting: Family Medicine

## 2019-12-24 MED FILL — HYDROCHLOROTHIAZIDE 25 MG T: 25 | 90 days supply | Qty: 90 | Fill #2

## 2019-12-25 MED FILL — LEVOTHYROXINE 137 MCG TAB: 137 | 90 days supply | Qty: 90 | Fill #0

## 2020-02-24 ENCOUNTER — Emergency Department (HOSPITAL_BASED_OUTPATIENT_CLINIC_OR_DEPARTMENT_OTHER)
Admission: EM | Admit: 2020-02-24 | Discharge: 2020-02-24 | Disposition: A | Discharge status: Home / Self Care | Payer: No Typology Code available for payment source | Attending: Emergency Medicine | Admitting: Emergency Medicine

## 2020-02-24 ENCOUNTER — Emergency Department (HOSPITAL_BASED_OUTPATIENT_CLINIC_OR_DEPARTMENT_OTHER): Payer: No Typology Code available for payment source

## 2020-02-24 ENCOUNTER — Encounter (HOSPITAL_BASED_OUTPATIENT_CLINIC_OR_DEPARTMENT_OTHER): Payer: Self-pay | Admitting: Emergency Medicine

## 2020-02-24 ENCOUNTER — Other Ambulatory Visit: Payer: Self-pay

## 2020-02-24 DIAGNOSIS — F17210 Nicotine dependence, cigarettes, uncomplicated: Secondary | ICD-10-CM | POA: Insufficient documentation

## 2020-02-24 DIAGNOSIS — I10 Essential (primary) hypertension: Secondary | ICD-10-CM | POA: Diagnosis not present

## 2020-02-24 DIAGNOSIS — E039 Hypothyroidism, unspecified: Secondary | ICD-10-CM | POA: Diagnosis not present

## 2020-02-24 DIAGNOSIS — M25561 Pain in right knee: Secondary | ICD-10-CM | POA: Diagnosis present

## 2020-02-24 DIAGNOSIS — M25562 Pain in left knee: Secondary | ICD-10-CM | POA: Insufficient documentation

## 2020-02-24 LAB — BASIC METABOLIC PANEL
Anion gap: 8 (ref 5–15)
BUN: 24 mg/dL — ABNORMAL HIGH (ref 6–20)
CO2: 25 mmol/L (ref 22–32)
Calcium: 8.8 mg/dL — ABNORMAL LOW (ref 8.9–10.3)
Chloride: 105 mmol/L (ref 98–111)
Creatinine, Ser: 1.36 mg/dL — ABNORMAL HIGH (ref 0.61–1.24)
GFR calc Af Amer: >60 mL/min (ref >60)
GFR calc non Af Amer: 58 mL/min — ABNORMAL LOW (ref >60)
Glucose, Bld: 100 mg/dL — ABNORMAL HIGH (ref 70–99)
Potassium: 4.4 mmol/L (ref 3.5–5.1)
Sodium: 138 mmol/L (ref 135–145)

## 2020-02-24 LAB — CBC
HCT: 40.9 % (ref 39.0–52.0)
Hemoglobin: 13.1 g/dL (ref 13.0–17.0)
MCH: 27.7 pg (ref 26.0–34.0)
MCHC: 32 g/dL (ref 30.0–36.0)
MCV: 86.5 fL (ref 80.0–100.0)
Platelets: 206 10*3/uL (ref 150–400)
RBC: 4.73 MIL/uL (ref 4.22–5.81)
RDW: 14.6 % (ref 11.5–15.5)
WBC: 5.3 10*3/uL (ref 4.0–10.5)
nRBC: 0 % (ref 0.0–0.2)

## 2020-02-24 LAB — CK: Total CK: 185 U/L (ref 49–397)

## 2020-02-24 MED ORDER — HYDROCODONE-ACETAMINOPHEN 5-325 MG PO TABS
1.0000 | ORAL_TABLET | Freq: Once | ORAL | Status: AC
  Administered 2020-02-24: 1 via ORAL
  Filled 2020-02-24: qty 1

## 2020-02-24 NOTE — ED Provider Notes (Signed)
[facility] Emergency Department Provider Note MRN:  [id number]  Arrival date & time: 02/24/20     Chief Complaint   Knee Pain   History of Present Illness   Christopher Howard is a 54 y.o. year-old male with a history of hypertension presenting to the ED with chief complaint of knee pain.  Bilateral knee pain, left greater than right.  Explains that he walks multiple miles a day.  He is also been on his hands and knees working on a deck over the past few days.  Denies any trauma, no fever, no other complaints.  Pain in the knee is moderate to severe, constant, difficulty bearing weight on the left leg.  Review of Systems  A complete 10 system review of systems was obtained and all systems are negative except as noted in the HPI and PMH.   Patient's Health History    Past Medical History:  Diagnosis Date   Diffuse toxic goiter    had radioiodine ablation on 06-23-11   GERD (gastroesophageal reflux disease)    pt denies GERD   Hyperlipidemia    Hypertension    Hypothyroidism (acquired)    sees Dr. Loanne Drilling    Pneumonia 12/2013   Seasonal allergies     Past Surgical History:  Procedure Laterality Date   COLONOSCOPY  06-26-14   per Dr. Fuller Plan, benign polyps, repeat in 61 yrs    laser surgery mouth  07/2013 & 08/2013   THYROIDECTOMY     WISDOM TOOTH EXTRACTION      Family History  Problem Relation Age of Onset   Throat cancer Father    Heart attack Father    Arthritis Other    Cancer Other        colon   Diabetes Other    Kidney disease Other    Stroke Other    Heart disease Other    Stroke Mother    Diabetes Maternal Uncle    Colon cancer Neg Hx    Esophageal cancer Neg Hx    Rectal cancer Neg Hx    Stomach cancer Neg Hx    Thyroid disease Neg Hx     Social History   Socioeconomic History   Marital status: Married    Spouse name: Not on file   Number of children: 2   Years of education: Not on file    Highest education level: Not on file  Occupational History   Occupation: clinical educator  Tobacco Use   Smoking status: Former Smoker    Packs/day: 0.25    Years: 20.00    Pack years: 5.00    Types: Cigarettes    Quit date: 08/21/2012    Years since quitting: 7.5   Smokeless tobacco: Never Used  Vaping Use   Vaping Use: Never used  Substance and Sexual Activity   Alcohol use: No    Alcohol/week: 0.0 standard drinks   Drug use: No   Sexual activity: Not on file  Other Topics Concern   Not on file  Social History Narrative   Regular exercise-yes   Social Determinants of Health   Financial Resource Strain:    Difficulty of Paying Living Expenses:   Food Insecurity:    Worried About Charity fundraiser in the Last Year:    Arboriculturist in the Last Year:   Transportation Needs:    Lack of Transportation (Medical):    Lack of Transportation (Non-Medical):   Physical Activity:  Days of Exercise per Week:    Minutes of Exercise per Session:   Stress:    Feeling of Stress :   Social Connections:    Frequency of Communication with Friends and Family:    Frequency of Social Gatherings with Friends and Family:    Attends Religious Services:    Active Member of Clubs or Organizations:    Attends Archivist Meetings:    Marital Status:   Intimate Partner Violence:    Fear of Current or Ex-Partner:    Emotionally Abused:    Physically Abused:    Sexually Abused:      Physical Exam   Vitals:   02/24/20 0110 02/24/20 0526  BP: 124/70 128/76  Pulse: 65 (!) 53  Resp: 14   Temp: 98.1 F (36.7 C)   SpO2: 96% 99%    CONSTITUTIONAL: Well-appearing, NAD NEURO:  Alert and oriented x 3, no focal deficits EYES:  eyes equal and reactive ENT/NECK:  no LAD, no JVD CARDIO: Regular rate, well-perfused, normal S1 and S2 PULM:  CTAB no wheezing or rhonchi GI/GU:  normal bowel sounds, non-distended, non-tender MSK/SPINE:  No gross  deformities, no edema; tenderness palpation to the bilateral knees, left greater than right, mild reduced range of motion due to pain SKIN:  no rash, atraumatic PSYCH:  Appropriate speech and behavior  *Additional and/or pertinent findings included in MDM below  Diagnostic and Interventional Summary    EKG Interpretation  Date/Time:    Ventricular Rate:    PR Interval:    QRS Duration:   QT Interval:    QTC Calculation:   R Axis:     Text Interpretation:        Labs Reviewed  BASIC METABOLIC PANEL - Abnormal; Notable for the following components:      Result Value   Glucose, Bld 100 (*)    BUN 24 (*)    Creatinine, Ser 1.36 (*)    Calcium 8.8 (*)    GFR calc non Af Amer 58 (*)    All other components within normal limits  CBC  CK    DG Knee Complete 4 Views Right  Final Result    DG Knee Complete 4 Views Left  Final Result      Medications  HYDROcodone-acetaminophen (NORCO/VICODIN) 5-325 MG per tablet 1 tablet (1 tablet Oral Given 02/24/20 0513)     Procedures  /  Critical Care Procedures  ED Course and Medical Decision Making  I have reviewed the triage vital signs, the nursing notes, and pertinent available records from the EMR.  Listed above are laboratory and imaging tests that I personally ordered, reviewed, and interpreted and then considered in my medical decision making (see below for details).      Suspect overuse injury or flare of arthritis due to recent exertional activities, no erythema or edema or effusion or fever to suggest septic joint.  Also considering rhabdomyolysis, will screen with labs.  Labs reassuring, x-ray revealing mild arthritis but no fracture.  Appropriate for discharge.  Barth Kirks. [place]onia Small, [place] [email or web address]  Final Clinical Impressions(s) / ED Diagnoses     ICD-10-CM   1. Acute pain of both knees  M25.561    M25.562     ED Discharge Orders    None        Discharge Instructions Discussed with and Provided to Patient:     Discharge Instructions     You were evaluated in  the Emergency Department and after careful evaluation, we did not find any emergent condition requiring admission or further testing in the hospital.  Your exam/testing today was overall reassuring.  Symptoms seem to be due to a flare of arthritis related to recent activities.  We recommend rest, ice, compression, and elevation over the next 2 days.  Please use Tylenol 1000 mg every 4-6 hours.  We also recommend Aleve 500 mg twice daily.  You can also use over-the-counter 4% lidocaine patches.  Please return to the Emergency Department if you experience any worsening of your condition.  We encourage you to follow up with a primary care provider.  Thank you for allowing [place] to be a part of your care.        Maudie Flakes, MD 02/24/20 [phone]

## 2020-02-24 NOTE — Discharge Instructions (Signed)
You were evaluated in the Emergency Department and after careful evaluation, we did not find any emergent condition requiring admission or further testing in the hospital.  Your exam/testing today was overall reassuring.  Symptoms seem to be due to a flare of arthritis related to recent activities.  We recommend rest, ice, compression, and elevation over the next 2 days.  Please use Tylenol 1000 mg every 4-6 hours.  We also recommend Aleve 500 mg twice daily.  You can also use over-the-counter 4% lidocaine patches.  Please return to the Emergency Department if you experience any worsening of your condition.  We encourage you to follow up with a primary care provider.  Thank you for allowing [place] to be a part of your care.

## 2020-02-24 NOTE — ED Triage Notes (Signed)
Pt is c/o left knee pain  Pt states pain started 3 days ago but the pain has gotten worse in the past 12 hours  Pt states he thinks it may be swollen as well

## 2020-03-19 ENCOUNTER — Other Ambulatory Visit: Payer: Self-pay | Admitting: Family Medicine

## 2020-03-19 MED FILL — HYDROCHLOROTHIAZIDE 25 MG T: 25 | 90 days supply | Qty: 90 | Fill #3

## 2020-03-19 MED FILL — LEVOTHYROXINE 137 MCG TAB: 137 | 90 days supply | Qty: 90 | Fill #0

## 2020-04-23 ENCOUNTER — Ambulatory Visit (INDEPENDENT_AMBULATORY_CARE_PROVIDER_SITE_OTHER): Payer: No Typology Code available for payment source | Admitting: Family Medicine

## 2020-04-23 ENCOUNTER — Other Ambulatory Visit: Payer: Self-pay

## 2020-04-23 ENCOUNTER — Encounter: Payer: Self-pay | Admitting: Family Medicine

## 2020-04-23 VITALS — BP 122/70 | HR 58 | Temp 97.9°F | Ht 69.5 in | Wt 258.6 lb

## 2020-04-23 DIAGNOSIS — Z0000 Encounter for general adult medical examination without abnormal findings: Secondary | ICD-10-CM | POA: Diagnosis not present

## 2020-04-23 DIAGNOSIS — R739 Hyperglycemia, unspecified: Secondary | ICD-10-CM

## 2020-04-23 DIAGNOSIS — E890 Postprocedural hypothyroidism: Secondary | ICD-10-CM

## 2020-04-23 NOTE — Progress Notes (Signed)
Subjective:    Patient ID: Christopher Howard, male    DOB: [birth year], 54 y.o.   MRN: [id number]  HPI Here for a well exam. He is doing well except for pain in the knees. The left hurts more than the right. He was in the ER on 02-24-20 and Xrays revealed early arthritis. He was told to take Ibuprofen as needed. This helps but it does not last very long.    Review of Systems  Constitutional: Negative.   HENT: Negative.   Eyes: Negative.   Respiratory: Negative.   Cardiovascular: Negative.   Gastrointestinal: Negative.   Genitourinary: Negative.   Musculoskeletal: Positive for arthralgias.  Skin: Negative.   Neurological: Negative.   Psychiatric/Behavioral: Negative.        Objective:   Physical Exam Constitutional:      General: He is not in acute distress.    Appearance: He is well-developed. He is obese. He is not diaphoretic.  HENT:     Head: Normocephalic and atraumatic.     Right Ear: External ear normal.     Left Ear: External ear normal.     Nose: Nose normal.     Mouth/Throat:     Pharynx: No oropharyngeal exudate.  Eyes:     General: No scleral icterus.       Right eye: No discharge.        Left eye: No discharge.     Conjunctiva/sclera: Conjunctivae normal.     Pupils: Pupils are equal, round, and reactive to light.  Neck:     Thyroid: No thyromegaly.     Vascular: No JVD.     Trachea: No tracheal deviation.  Cardiovascular:     Rate and Rhythm: Normal rate and regular rhythm.     Heart sounds: Normal heart sounds. No murmur heard.  No friction rub. No gallop.   Pulmonary:     Effort: Pulmonary effort is normal. No respiratory distress.     Breath sounds: Normal breath sounds. No wheezing or rales.  Chest:     Chest wall: No tenderness.  Abdominal:     General: Bowel sounds are normal. There is no distension.     Palpations: Abdomen is soft. There is no mass.     Tenderness: There is no abdominal tenderness. There is no guarding or rebound.    Genitourinary:    Penis: Normal. No tenderness.      Testes: Normal.     Prostate: Normal.     Rectum: Normal. Guaiac result negative.  Musculoskeletal:        General: No tenderness. Normal range of motion.     Cervical back: Neck supple.  Lymphadenopathy:     Cervical: No cervical adenopathy.  Skin:    General: Skin is warm and dry.     Coloration: Skin is not pale.     Findings: No erythema or rash.  Neurological:     Mental Status: He is alert and oriented to person, place, and time.     Cranial Nerves: No cranial nerve deficit.     Motor: No abnormal muscle tone.     Coordination: Coordination normal.     Deep Tendon Reflexes: Reflexes are normal and symmetric. Reflexes normal.  Psychiatric:        Behavior: Behavior normal.        Thought Content: Thought content normal.        Judgment: Judgment normal.           Assessment &  Plan:  Well exam. We discussed diet and exercise. Get fasting labs. He can try Aleve BID as needed for joint pains. Alysia Penna, MD

## 2020-04-24 LAB — BASIC METABOLIC PANEL
BUN/Creatinine Ratio: 18 (calc) (ref 6–22)
BUN: 25 mg/dL (ref 7–25)
CO2: 26 mmol/L (ref 20–32)
Calcium: 9.5 mg/dL (ref 8.6–10.3)
Chloride: 102 mmol/L (ref 98–110)
Creat: 1.39 mg/dL — ABNORMAL HIGH (ref 0.70–1.33)
Glucose, Bld: 77 mg/dL (ref 65–99)
Potassium: 4.4 mmol/L (ref 3.5–5.3)
Sodium: 137 mmol/L (ref 135–146)

## 2020-04-24 LAB — HEPATIC FUNCTION PANEL
AG Ratio: 1.4 (calc) (ref 1.0–2.5)
ALT: 14 U/L (ref 9–46)
AST: 16 U/L (ref 10–35)
Albumin: 4.3 g/dL (ref 3.6–5.1)
Alkaline phosphatase (APISO): 94 U/L (ref 35–144)
Bilirubin, Direct: 0.1 mg/dL (ref 0.0–0.2)
Globulin: 3.1 g/dL (calc) (ref 1.9–3.7)
Indirect Bilirubin: 0.3 mg/dL (calc) (ref 0.2–1.2)
Total Bilirubin: 0.4 mg/dL (ref 0.2–1.2)
Total Protein: 7.4 g/dL (ref 6.1–8.1)

## 2020-04-24 LAB — LIPID PANEL
Cholesterol: 174 mg/dL (ref <200)
HDL: 47 mg/dL (ref >=40)
LDL Cholesterol (Calc): 107 mg/dL (calc) — ABNORMAL HIGH
Non-HDL Cholesterol (Calc): 127 mg/dL (calc) (ref <130)
Total CHOL/HDL Ratio: 3.7 (calc) (ref <5.0)
Triglycerides: 102 mg/dL (ref <150)

## 2020-04-24 LAB — HEMOGLOBIN A1C
Hgb A1c MFr Bld: 5.9 % of total Hgb — ABNORMAL HIGH (ref <5.7)
Mean Plasma Glucose: 123 (calc)
eAG (mmol/L): 6.8 (calc)

## 2020-04-24 LAB — T3, FREE: T3, Free: 3.6 pg/mL (ref 2.3–4.2)

## 2020-04-24 LAB — PSA: PSA: 1.4 ng/mL (ref <=4.0)

## 2020-04-24 LAB — TSH: TSH: <0.01 mIU/L — ABNORMAL LOW (ref 0.40–4.50)

## 2020-04-24 LAB — T4, FREE: Free T4: 1.6 ng/dL (ref 0.8–1.8)

## 2020-04-28 ENCOUNTER — Other Ambulatory Visit: Payer: Self-pay | Admitting: Family Medicine

## 2020-04-28 ENCOUNTER — Encounter: Payer: Self-pay | Admitting: Family Medicine

## 2020-04-28 MED ORDER — LEVOTHYROXINE SODIUM 150 MCG PO TABS
150.0000 ug | ORAL_TABLET | Freq: Every day | ORAL | 3 refills | Status: DC
Start: 2020-04-28 — End: 2020-04-28

## 2020-04-28 MED FILL — LEVOTHYROXINE SODIUM 150 MC: 150 | 90 days supply | Qty: 90 | Fill #0

## 2020-04-28 NOTE — Telephone Encounter (Signed)
To round things out, we will increase the Synthroid to 150 mcg daily, call in #[phone] with 3 rf

## 2020-06-28 ENCOUNTER — Other Ambulatory Visit: Payer: Self-pay | Admitting: Family Medicine

## 2020-06-28 MED FILL — HYDROCHLOROTHIAZIDE 25 MG T: 25 | 90 days supply | Qty: 90 | Fill #0

## 2020-07-12 ENCOUNTER — Encounter: Payer: Self-pay | Admitting: Family Medicine

## 2020-07-12 DIAGNOSIS — M25561 Pain in right knee: Secondary | ICD-10-CM

## 2020-07-12 DIAGNOSIS — G8929 Other chronic pain: Secondary | ICD-10-CM

## 2020-07-13 NOTE — Telephone Encounter (Signed)
I did a referral to Orthopedics  

## 2020-07-26 ENCOUNTER — Encounter: Payer: Self-pay | Admitting: Orthopedic Surgery

## 2020-07-26 ENCOUNTER — Other Ambulatory Visit: Payer: Self-pay

## 2020-07-26 ENCOUNTER — Ambulatory Visit (INDEPENDENT_AMBULATORY_CARE_PROVIDER_SITE_OTHER): Payer: No Typology Code available for payment source | Admitting: Physician Assistant

## 2020-07-26 VITALS — Ht 69.5 in | Wt 258.0 lb

## 2020-07-26 DIAGNOSIS — M7652 Patellar tendinitis, left knee: Secondary | ICD-10-CM

## 2020-07-26 NOTE — Progress Notes (Signed)
Office Visit Note   Patient: Christopher Howard           Date of Birth: May 20, 1966           MRN: [id number] Visit Date: 07/26/2020              Requested by: Laurey Morale, MD [place],  [place] [address] PCP: Laurey Morale, MD  Chief Complaint  Patient presents with  . Right Knee - Pain  . Left Knee - Pain      HPI: This is a pleasant 54 year old gentleman who presents today with a chief complaint of left greater than right anterior knee pain.  He says this is been going on since the spring.  He relates it to building a deck and walking up and down an incline to do so.  He also relates a history of having some difficulties with his thyroid and having his weight fluctuate.  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  Currently he is taking 400 mg of ibuprofen in the morning and 200 in the evening and this has helped significantly but not completely made things better  Assessment & Plan: Visit Diagnoses: No diagnosis found.  Plan: Findings consistent with a patellar tendinitis at the insertion.  I discussed with him the natural history of this.  I asked for the next 2 weeks that he take 600 mg of ibuprofen 3 times daily with food.  Also discussed with him refraining from deep squatting which she does quite a bit and working on quadricep strengthening using such activities as straight leg raises.  Also we discussed using topical Voltaren gel.  He will follow-up in 3 weeks they may cancel this appointment if he is doing well Follow-Up Instructions: No follow-ups on file.   Ortho Exam  Patient is alert, oriented, no adenopathy, well-dressed, normal affect, normal respiratory effort. Left knee.  And right knee have no effusions no cellulitis he is not really tender around the joint lines but on the left knee he is has a tender area at the insertion of the patellar tendon into the  patella.  This can be replicated with resisted leg extension.  Not significantly tender on the joint lines.  X-rays from the emergency department were reviewed.  Some early degenerative changes in the left knee but overall well-maintained alignment  Imaging: No results found. No images are attached to the encounter.  Labs: Lab Results  Component Value Date   HGBA1C 5.9 (H) 04/23/2020   HGBA1C 6.3 03/26/2019   HGBA1C 6.0 (H) 10/08/2012     Lab Results  Component Value Date   ALBUMIN 4.0 03/26/2019   ALBUMIN 4.2 02/11/2018   ALBUMIN 4.2 10/30/2016    No results found for: MG No results found for: VD25OH  No results found for: PREALBUMIN CBC EXTENDED Latest Ref Rng & Units 02/24/2020 03/26/2019 02/11/2018  WBC 4.0 - 10.5 K/uL 5.3 4.3 4.5  RBC 4.22 - 5.81 MIL/uL 4.73 4.94 4.76  HGB 13.0 - 17.0 g/dL 13.1 13.8 13.4  HCT 39 - 52 % 40.9 42.2 40.4  PLT 150 - 400 K/uL 206 198.0 198.0  NEUTROABS 1.4 - 7.7 K/uL - 2.1 2.3  LYMPHSABS 0.7 - 4.0 K/uL - 1.5 1.4     Body mass index is 37.55 kg/m.  Orders:  No orders of the defined types were placed in this encounter.  No orders of the defined types were placed in this encounter.    Procedures: No procedures performed  Clinical Data: No additional findings.  ROS:  All other systems negative, except as noted in the HPI. Review of Systems  Objective: Vital Signs: Ht 5' 9.5" (1.765 m)   Wt 258 lb (117 kg)   BMI 37.55 kg/m   Specialty Comments:  No specialty comments available.  PMFS History: Patient Active Problem List   Diagnosis Date Noted  . OSA (obstructive sleep apnea) 11/16/2014  . HTN (hypertension) 10/14/2012  . 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 Hyperlipidemia 11/13/2007  . GERD 05/20/2007   Past Medical History:  Diagnosis Date  . Diffuse toxic goiter    had radioiodine ablation on 06-23-11  . GERD (gastroesophageal reflux disease)    pt denies GERD  . Hyperlipidemia   . Hypertension   .  Hypothyroidism (acquired)    sees Dr. Loanne Drilling   . Pneumonia 12/2013  . Seasonal allergies     Family History  Problem Relation Age of Onset  . Throat cancer Father   . Heart attack Father   . Arthritis Other   . Cancer Other        colon  . Diabetes Other   . Kidney disease Other   . Stroke Other   . Heart disease Other   . Stroke Mother   . Diabetes Maternal Uncle   . Colon cancer Neg Hx   . Esophageal cancer Neg Hx   . Rectal cancer Neg Hx   . Stomach cancer Neg Hx   . Thyroid disease Neg Hx     Past Surgical History:  Procedure Laterality Date  . COLONOSCOPY  06-26-14   per Dr. Fuller Plan, benign polyps, repeat in 10 yrs   . laser surgery mouth  07/2013 & 08/2013  . THYROIDECTOMY    . WISDOM TOOTH EXTRACTION     Social History   Occupational History  . Occupation: clinical educator  Tobacco Use  . Smoking status: Former Smoker    Packs/day: 0.25    Years: 20.00    Pack years: 5.00    Types: Cigarettes    Quit date: 08/21/2012    Years since quitting: 7.9  . Smokeless tobacco: Never Used  Vaping Use  . Vaping Use: Never used  Substance and Sexual Activity  . Alcohol use: No    Alcohol/week: 0.0 standard drinks  . Drug use: No  . Sexual activity: Not on file

## 2020-08-04 MED FILL — LEVOTHYROXINE SODIUM 150 MC: 150 | 90 days supply | Qty: 90 | Fill #1

## 2020-08-19 ENCOUNTER — Encounter: Payer: Self-pay | Admitting: Orthopedic Surgery

## 2020-08-19 ENCOUNTER — Ambulatory Visit (INDEPENDENT_AMBULATORY_CARE_PROVIDER_SITE_OTHER): Payer: No Typology Code available for payment source | Admitting: Orthopedic Surgery

## 2020-08-19 DIAGNOSIS — M25562 Pain in left knee: Secondary | ICD-10-CM | POA: Diagnosis not present

## 2020-08-19 NOTE — Progress Notes (Signed)
Office Visit Note   Patient: Christopher Howard           Date of Birth: 10-Feb-1966           MRN: [id number] Visit Date: 08/19/2020              Requested by: Laurey Morale, MD [place],  [place] [address] PCP: Laurey Morale, MD  Chief Complaint  Patient presents with  . Left Knee - Follow-up  . Right Knee - Follow-up      HPI: Patient is a 54 year old gentleman who presents complaining of persistent anterior left knee pain.  He states that ibuprofen has helped he states that he has increased pain after prolonged driving or prolonged sitting with pain with getting from a sitting to a standing position.  He states his right knee is doing fine at this time.  Assessment & Plan: Visit Diagnoses:  1. Acute pain of left knee     Plan: Patient was given instructions and demonstrated close chain kinetic exercises.  Recommended topical Voltaren gel.  Discussed that we could set him up for formalized physical therapy if he wanted to pursue that route.  Follow-Up Instructions: Return if symptoms worsen or fail to improve.   Ortho Exam  Patient is alert, oriented, no adenopathy, well-dressed, normal affect, normal respiratory effort. Examination patient has no effusion of the left knee the patella tendon is minimally tender to palpation he has minimal tenderness palpation around the patellofemoral joint.  There is no effusion.  Collaterals and cruciates are stable he does have an old scar over the patella tendon from a injury as a child.  Review of the radiographs shows no bony abnormalities.  He does have pain with activation of the VMO.  Imaging: No results found. No images are attached to the encounter.  Labs: Lab Results  Component Value Date   HGBA1C 5.9 (H) 04/23/2020   HGBA1C 6.3 03/26/2019   HGBA1C 6.0 (H) 10/08/2012     Lab Results  Component Value Date   ALBUMIN 4.0 03/26/2019   ALBUMIN 4.2 02/11/2018   ALBUMIN 4.2 10/30/2016    No results  found for: MG No results found for: VD25OH  No results found for: PREALBUMIN CBC EXTENDED Latest Ref Rng & Units 02/24/2020 03/26/2019 02/11/2018  WBC 4.0 - 10.5 K/uL 5.3 4.3 4.5  RBC 4.22 - 5.81 MIL/uL 4.73 4.94 4.76  HGB 13.0 - 17.0 g/dL 13.1 13.8 13.4  HCT 39.0 - 52.0 % 40.9 42.2 40.4  PLT 150 - 400 K/uL 206 198.0 198.0  NEUTROABS 1.4 - 7.7 K/uL - 2.1 2.3  LYMPHSABS 0.7 - 4.0 K/uL - 1.5 1.4     There is no height or weight on file to calculate BMI.  Orders:  No orders of the defined types were placed in this encounter.  No orders of the defined types were placed in this encounter.    Procedures: No procedures performed  Clinical Data: No additional findings.  ROS:  All other systems negative, except as noted in the HPI. Review of Systems  Objective: Vital Signs: There were no vitals taken for this visit.  Specialty Comments:  No specialty comments available.  PMFS History: Patient Active Problem List   Diagnosis Date Noted  . OSA (obstructive sleep apnea) 11/16/2014  . HTN (hypertension) 10/14/2012  . 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 Hyperlipidemia 11/13/2007  . GERD 05/20/2007   Past Medical History:  Diagnosis Date  . Diffuse toxic goiter  had radioiodine ablation on 06-23-11  . GERD (gastroesophageal reflux disease)    pt denies GERD  . Hyperlipidemia   . Hypertension   . Hypothyroidism (acquired)    sees Dr. Everardo All   . Pneumonia 12/2013  . Seasonal allergies     Family History  Problem Relation Age of Onset  . Throat cancer Father   . Heart attack Father   . Arthritis Other   . Cancer Other        colon  . Diabetes Other   . Kidney disease Other   . Stroke Other   . Heart disease Other   . Stroke Mother   . Diabetes Maternal Uncle   . Colon cancer Neg Hx   . Esophageal cancer Neg Hx   . Rectal cancer Neg Hx   . Stomach cancer Neg Hx   . Thyroid disease Neg Hx     Past Surgical History:  Procedure Laterality Date  .  COLONOSCOPY  06-26-14   per Dr. Russella Dar, benign polyps, repeat in 10 yrs   . laser surgery mouth  07/2013 & 08/2013  . THYROIDECTOMY    . WISDOM TOOTH EXTRACTION     Social History   Occupational History  . Occupation: clinical educator  Tobacco Use  . Smoking status: Former Smoker    Packs/day: 0.25    Years: 20.00    Pack years: 5.00    Types: Cigarettes    Quit date: 08/21/2012    Years since quitting: 8.0  . Smokeless tobacco: Never Used  Vaping Use  . Vaping Use: Never used  Substance and Sexual Activity  . Alcohol use: No    Alcohol/week: 0.0 standard drinks  . Drug use: No  . Sexual activity: Not on file

## 2020-09-22 ENCOUNTER — Other Ambulatory Visit: Payer: Self-pay | Admitting: Family Medicine

## 2020-09-22 ENCOUNTER — Encounter: Payer: Self-pay | Admitting: Family Medicine

## 2020-09-22 MED FILL — HYDROCHLOROTHIAZIDE 25 MG T: 25 | 90 days supply | Qty: 90 | Fill #0

## 2020-09-22 NOTE — Telephone Encounter (Signed)
Tell him I have read about this product and it makes me nervous. I think it will cause a lot of serious side effects for people, so I will NOT be prescribing it

## 2020-12-08 ENCOUNTER — Encounter: Payer: Self-pay | Admitting: Family Medicine

## 2020-12-08 NOTE — Telephone Encounter (Signed)
Have him contact Pulmonary

## 2020-12-15 ENCOUNTER — Ambulatory Visit: Payer: No Typology Code available for payment source | Admitting: Acute Care

## 2020-12-17 ENCOUNTER — Telehealth: Payer: Self-pay

## 2020-12-17 NOTE — Telephone Encounter (Signed)
Spoke with patient about sending CPAP download for Monday's visit. Visit converted to televisit due to his wife testing positive for COVID and he is still pending his test result.

## 2020-12-20 ENCOUNTER — Encounter: Payer: Self-pay | Admitting: Primary Care

## 2020-12-20 ENCOUNTER — Ambulatory Visit (INDEPENDENT_AMBULATORY_CARE_PROVIDER_SITE_OTHER): Payer: No Typology Code available for payment source | Admitting: Primary Care

## 2020-12-20 ENCOUNTER — Other Ambulatory Visit: Payer: Self-pay

## 2020-12-20 DIAGNOSIS — Z9989 Dependence on other enabling machines and devices: Secondary | ICD-10-CM

## 2020-12-20 DIAGNOSIS — G4733 Obstructive sleep apnea (adult) (pediatric): Secondary | ICD-10-CM

## 2020-12-20 NOTE — Patient Instructions (Signed)
Continue to wear CPAP every night for 4-6 hours or longer Do not drive if experiencing excessive daytime fatigue  Follow-up 1 year with Dr. Halford Chessman

## 2020-12-20 NOTE — Progress Notes (Signed)
Virtual Visit via Video Note  I connected with Christopher Howard on 12/20/20 at  3:00 PM EDT by a video enabled telemedicine application and verified that I am speaking with the correct person using two identifiers.  Location: Patient: Home Provider: Office    I discussed the limitations of evaluation and management by telemedicine and the availability of in person appointments. The patient expressed understanding and agreed to proceed.  History of Present Illness: 55 year old male, former smoker quit in 2014 (5-pack-year history).  Past medical history significant for hypertension, OSA, GERD, hypothyroidism, hyperlipidemia.  Patient of Dr. Halford Chessman, last seen for virtual visit in December 2020. HST 2016 showed severe OSA, average AHI 30/hr with SpO2 low 70%. Maintained on auto CPAP.   Previous [facility] pulmonary encounter: 07/24/19- Dr. Halford Chessman He has been doing well with CPAP.  Tried nasal mask, but pressure felt too high in his sinuses.  Changed back to full face mask, and doing better.  Not having sinus congestion, sore throat, dry mouth, or aerophagia. Has noticed weight gain.  Had thyroid medication adjusted by PCP.  12/20/2020- Interim hx  Patient contacted today for follow-up OSA/televisit. He is doing well, no issues with mask fit or pressure setting. He is compliant with CPAP use. He works at night, sometimes doesn't finished until later which accounts for the three times he did not wear his mask this past month. Using full face mask, he was unable to use nasal mask d/t sleeping with his mouth open. He bought a so clean device. He needs CPAP supplies renewed.  DME [facility].    Observations/Objective:  - Able to speak in full sentences; no overt shortness of breath/wheezing   Sleep test: HST 11/18/14 >> AHI 30, SpO2 low 70%  Auto CPAP 06/23/19 o 07/23/19 >> used on 30 of 30 nights with average 7 hrs 43 min. Average AHI 2.9 with 90% setting of 13 cm H2O.  Auto CPAP 11/19/20-12/19/20 >> used 27/30  nights with average 8 hours.  Average AHI 4.0 with 90% setting 13.5cm h20   Assessment and Plan:  OSA: - Patient is 90% compliant with CPAP, using average 8 hours a night  - Pressure 13.5cm h20; residual AHI 4.0  - No changes today, renew CPAP supplies with [facility] - Encourage maintaining healthy weight, advised not to drive if experiencing excessive daytime sleepiness  Follow Up Instructions:   - FU in 1 year Dr. Halford Chessman or APP  I discussed the assessment and treatment plan with the patient. The patient was provided an opportunity to ask questions and all were answered. The patient agreed with the plan and demonstrated an understanding of the instructions.   The patient was advised to call back or seek an in-person evaluation if the symptoms worsen or if the condition fails to improve as anticipated.  I provided 22 minutes of non-face-to-face time during this encounter.   Martyn Ehrich, NP

## 2020-12-21 NOTE — Progress Notes (Signed)
Reviewed and agree with assessment/plan.   Chesley Mires, MD [facility] 12/21/2020, 8:37 AM Pager:  [phone]

## 2020-12-22 ENCOUNTER — Telehealth: Payer: Self-pay | Admitting: Primary Care

## 2020-12-22 NOTE — Telephone Encounter (Signed)
Called and spoke with Patient. Patient stated [employer] contacted him and stated [employer] no longer accpets his insurance.  Patient stated he has been with [employer] a long time and had no issues until now. Patient stated if [employer] does not accept his insurance anymore he needs a new DME company.  Message routed to [facility] to assist

## 2020-12-22 NOTE — Telephone Encounter (Addendum)
Pt now has [facility] and not in-network with [employer].  Will send order to [facility]. Complete sleep study is not scanned - interpretation is not scanned.Marland Kitchen  Appears pt had hst set up thru sleep lab.  I called & spoke to pt & he verified this.  Told him I would send order to [facility] once I can get study.  I called sleep lab & left vm for call back regarding study.  I spoke to Tiffany at [facility] to see if they have copy and all they have is what I have that was scanned. Will wait for call back from sleep lab.

## 2020-12-23 NOTE — Telephone Encounter (Signed)
Spoke to Kia at sleep lab.  Interpretation was not scanned to chart.  She saw [place] documented in his note the results but she states sometimes he did not have interpretation scanned.  I called Melissa at [employer] to see if they can use what we have to process order.  She states she is not sure what the [facility] requirements are and to send them order and then they can let us know if new study is needed.  Order sent to [facility].  Nothing further needed at this time.

## 2020-12-31 ENCOUNTER — Other Ambulatory Visit (HOSPITAL_COMMUNITY): Payer: Self-pay

## 2020-12-31 MED FILL — Hydrochlorothiazide Tab 25 MG: ORAL | 90 days supply | Qty: 90 | Fill #0 | Status: AC

## 2021-01-28 ENCOUNTER — Other Ambulatory Visit (HOSPITAL_COMMUNITY): Payer: Self-pay

## 2021-01-28 MED FILL — Levothyroxine Sodium Tab 150 MCG: ORAL | 90 days supply | Qty: 90 | Fill #0 | Status: AC

## 2021-04-06 ENCOUNTER — Other Ambulatory Visit: Payer: Self-pay | Admitting: Family Medicine

## 2021-04-06 ENCOUNTER — Other Ambulatory Visit (HOSPITAL_COMMUNITY): Payer: Self-pay

## 2021-04-06 MED ORDER — HYDROCHLOROTHIAZIDE 25 MG PO TABS
25.0000 mg | ORAL_TABLET | Freq: Every day | ORAL | 0 refills | Status: DC
  Filled 2021-04-06 – 2021-04-14 (×2): qty 90, 90d supply, fill #0

## 2021-04-06 NOTE — Telephone Encounter (Signed)
Pt needs appointment for further refills 

## 2021-04-14 ENCOUNTER — Other Ambulatory Visit (HOSPITAL_COMMUNITY): Payer: Self-pay

## 2021-04-14 ENCOUNTER — Other Ambulatory Visit: Payer: Self-pay | Admitting: Family Medicine

## 2021-04-15 ENCOUNTER — Other Ambulatory Visit (HOSPITAL_COMMUNITY): Payer: Self-pay

## 2021-04-15 MED ORDER — LEVOTHYROXINE SODIUM 150 MCG PO TABS
150.0000 ug | ORAL_TABLET | Freq: Every day | ORAL | 0 refills | Status: DC
  Filled 2021-04-15: qty 30, 30d supply, fill #0

## 2021-04-27 ENCOUNTER — Ambulatory Visit (INDEPENDENT_AMBULATORY_CARE_PROVIDER_SITE_OTHER): Payer: No Typology Code available for payment source | Admitting: Family Medicine

## 2021-04-27 ENCOUNTER — Other Ambulatory Visit: Payer: Self-pay

## 2021-04-27 ENCOUNTER — Other Ambulatory Visit (HOSPITAL_COMMUNITY): Payer: Self-pay

## 2021-04-27 ENCOUNTER — Encounter: Payer: Self-pay | Admitting: Family Medicine

## 2021-04-27 VITALS — BP 128/80 | HR 63 | Temp 98.7°F | Ht 69.5 in | Wt 272.0 lb

## 2021-04-27 DIAGNOSIS — E890 Postprocedural hypothyroidism: Secondary | ICD-10-CM | POA: Diagnosis not present

## 2021-04-27 DIAGNOSIS — Z0000 Encounter for general adult medical examination without abnormal findings: Secondary | ICD-10-CM

## 2021-04-27 MED ORDER — HYDROCHLOROTHIAZIDE 25 MG PO TABS
25.0000 mg | ORAL_TABLET | Freq: Every day | ORAL | 3 refills | Status: DC
  Filled 2021-04-27 – 2021-10-24 (×3): qty 90, 90d supply, fill #0
  Filled 2022-01-17: qty 90, 90d supply, fill #1
  Filled 2022-04-13: qty 90, 90d supply, fill #2

## 2021-04-27 NOTE — Progress Notes (Signed)
Subjective:    Patient ID: Christopher Howard, male    DOB: September 29, 1965, 55 y.o.   MRN: [id number]  HPI Here for a well exam. He feels fine but he is frustrated by his inability to lose weight. He eats a healthy diet and he gets plenty of exercise. He likes to ride his bicycle, and he averages 10-15 miles a day.    Review of Systems  Constitutional: Negative.   HENT: Negative.    Eyes: Negative.   Respiratory: Negative.    Cardiovascular: Negative.   Gastrointestinal: Negative.   Genitourinary: Negative.   Musculoskeletal: Negative.   Skin: Negative.   Neurological: Negative.   Psychiatric/Behavioral: Negative.        Objective:   Physical Exam Constitutional:      General: He is not in acute distress.    Appearance: Normal appearance. He is well-developed. He is not diaphoretic.  HENT:     Head: Normocephalic and atraumatic.     Right Ear: External ear normal.     Left Ear: External ear normal.     Nose: Nose normal.     Mouth/Throat:     Pharynx: No oropharyngeal exudate.  Eyes:     General: No scleral icterus.       Right eye: No discharge.        Left eye: No discharge.     Conjunctiva/sclera: Conjunctivae normal.     Pupils: Pupils are equal, round, and reactive to light.  Neck:     Thyroid: No thyromegaly.     Vascular: No JVD.     Trachea: No tracheal deviation.  Cardiovascular:     Rate and Rhythm: Normal rate and regular rhythm.     Heart sounds: Normal heart sounds. No murmur heard.   No friction rub. No gallop.  Pulmonary:     Effort: Pulmonary effort is normal. No respiratory distress.     Breath sounds: Normal breath sounds. No wheezing or rales.  Chest:     Chest wall: No tenderness.  Abdominal:     General: Bowel sounds are normal. There is no distension.     Palpations: Abdomen is soft. There is no mass.     Tenderness: There is no abdominal tenderness. There is no guarding or rebound.  Genitourinary:    Penis: Normal. No tenderness.       Testes: Normal.     Prostate: Normal.     Rectum: Normal. Guaiac result negative.  Musculoskeletal:        General: No tenderness. Normal range of motion.     Cervical back: Neck supple.  Lymphadenopathy:     Cervical: No cervical adenopathy.  Skin:    General: Skin is warm and dry.     Coloration: Skin is not pale.     Findings: No erythema or rash.  Neurological:     Mental Status: He is alert and oriented to person, place, and time.     Cranial Nerves: No cranial nerve deficit.     Motor: No abnormal muscle tone.     Coordination: Coordination normal.     Deep Tendon Reflexes: Reflexes are normal and symmetric. Reflexes normal.  Psychiatric:        Behavior: Behavior normal.        Thought Content: Thought content normal.        Judgment: Judgment normal.          Assessment & Plan:  Well exam. We discussed diet and exercise. Get fasting  labs with particular attention to his thyroid panel.  Alysia Penna, MD

## 2021-04-28 ENCOUNTER — Other Ambulatory Visit (INDEPENDENT_AMBULATORY_CARE_PROVIDER_SITE_OTHER): Payer: No Typology Code available for payment source

## 2021-04-28 DIAGNOSIS — E890 Postprocedural hypothyroidism: Secondary | ICD-10-CM

## 2021-04-28 DIAGNOSIS — Z0000 Encounter for general adult medical examination without abnormal findings: Secondary | ICD-10-CM | POA: Diagnosis not present

## 2021-04-28 LAB — CBC WITH DIFFERENTIAL/PLATELET
Basophils Absolute: 0.1 10*3/uL (ref 0.0–0.1)
Basophils Relative: 1.1 % (ref 0.0–3.0)
Eosinophils Absolute: 0.2 10*3/uL (ref 0.0–0.7)
Eosinophils Relative: 3.3 % (ref 0.0–5.0)
HCT: 43.3 % (ref 39.0–52.0)
Hemoglobin: 14.2 g/dL (ref 13.0–17.0)
Lymphocytes Relative: 33 % (ref 12.0–46.0)
Lymphs Abs: 1.6 10*3/uL (ref 0.7–4.0)
MCHC: 32.9 g/dL (ref 30.0–36.0)
MCV: 84.7 fl (ref 78.0–100.0)
Monocytes Absolute: 0.6 10*3/uL (ref 0.1–1.0)
Monocytes Relative: 12 % (ref 3.0–12.0)
Neutro Abs: 2.4 10*3/uL (ref 1.4–7.7)
Neutrophils Relative %: 50.6 % (ref 43.0–77.0)
Platelets: 236 10*3/uL (ref 150.0–400.0)
RBC: 5.11 Mil/uL (ref 4.22–5.81)
RDW: 15.3 % (ref 11.5–15.5)
WBC: 4.7 10*3/uL (ref 4.0–10.5)

## 2021-04-28 LAB — HEPATIC FUNCTION PANEL
ALT: 15 U/L (ref 0–53)
AST: 16 U/L (ref 0–37)
Albumin: 4.1 g/dL (ref 3.5–5.2)
Alkaline Phosphatase: 88 U/L (ref 39–117)
Bilirubin, Direct: 0.1 mg/dL (ref 0.0–0.3)
Total Bilirubin: 0.6 mg/dL (ref 0.2–1.2)
Total Protein: 7.6 g/dL (ref 6.0–8.3)

## 2021-04-28 LAB — LIPID PANEL
Cholesterol: 190 mg/dL (ref 0–200)
HDL: 48 mg/dL (ref >39.00)
LDL Cholesterol: 127 mg/dL — ABNORMAL HIGH (ref 0–99)
NonHDL: 142.12
Total CHOL/HDL Ratio: 4
Triglycerides: 74 mg/dL (ref 0.0–149.0)
VLDL: 14.8 mg/dL (ref 0.0–40.0)

## 2021-04-28 LAB — BASIC METABOLIC PANEL
BUN: 26 mg/dL — ABNORMAL HIGH (ref 6–23)
CO2: 26 mEq/L (ref 19–32)
Calcium: 9.5 mg/dL (ref 8.4–10.5)
Chloride: 103 mEq/L (ref 96–112)
Creatinine, Ser: 1.51 mg/dL — ABNORMAL HIGH (ref 0.40–1.50)
GFR: 50.91 mL/min — ABNORMAL LOW (ref >60.00)
Glucose, Bld: 97 mg/dL (ref 70–99)
Potassium: 3.9 mEq/L (ref 3.5–5.1)
Sodium: 138 mEq/L (ref 135–145)

## 2021-04-28 LAB — PSA: PSA: 1.96 ng/mL (ref 0.10–4.00)

## 2021-04-28 LAB — TSH: TSH: <0.01 u[IU]/mL — ABNORMAL LOW (ref 0.35–5.50)

## 2021-04-28 LAB — T4, FREE: Free T4: 1.16 ng/dL (ref 0.60–1.60)

## 2021-04-28 LAB — HEMOGLOBIN A1C: Hgb A1c MFr Bld: 6.1 % (ref 4.6–6.5)

## 2021-04-28 LAB — T3, FREE: T3, Free: 3.3 pg/mL (ref 2.3–4.2)

## 2021-04-30 ENCOUNTER — Encounter: Payer: Self-pay | Admitting: Family Medicine

## 2021-04-30 DIAGNOSIS — E890 Postprocedural hypothyroidism: Secondary | ICD-10-CM

## 2021-05-02 NOTE — Telephone Encounter (Signed)
I did the referral to Dr. Buddy Duty

## 2021-06-13 ENCOUNTER — Other Ambulatory Visit (HOSPITAL_COMMUNITY): Payer: Self-pay

## 2021-06-13 ENCOUNTER — Other Ambulatory Visit: Payer: Self-pay | Admitting: Family Medicine

## 2021-06-14 ENCOUNTER — Other Ambulatory Visit: Payer: Self-pay | Admitting: Family Medicine

## 2021-06-14 ENCOUNTER — Other Ambulatory Visit (HOSPITAL_COMMUNITY): Payer: Self-pay

## 2021-06-15 ENCOUNTER — Other Ambulatory Visit (HOSPITAL_COMMUNITY): Payer: Self-pay

## 2021-06-15 IMAGING — DX DG KNEE COMPLETE 4+V*R*
4 series · 4 of 4 positions shown · non-contrast
Comparison: None.

CLINICAL DATA: Knee pain for 3 days, worsening. No reported history
of trauma

EXAM:
RIGHT KNEE - COMPLETE 4+ VIEW

[knee ap]
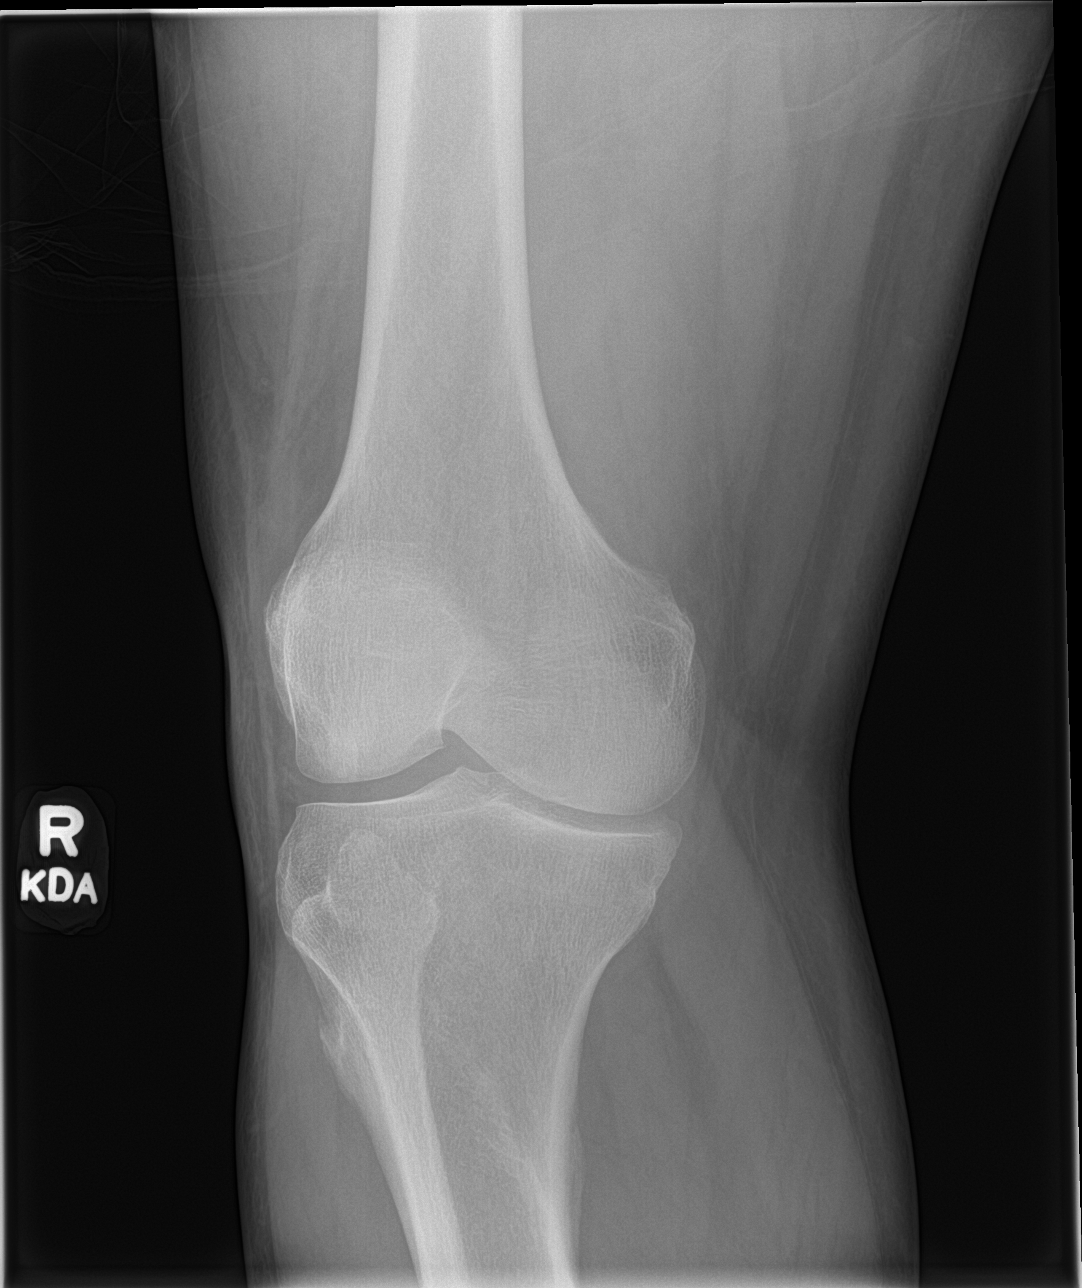

[knee tunnel]
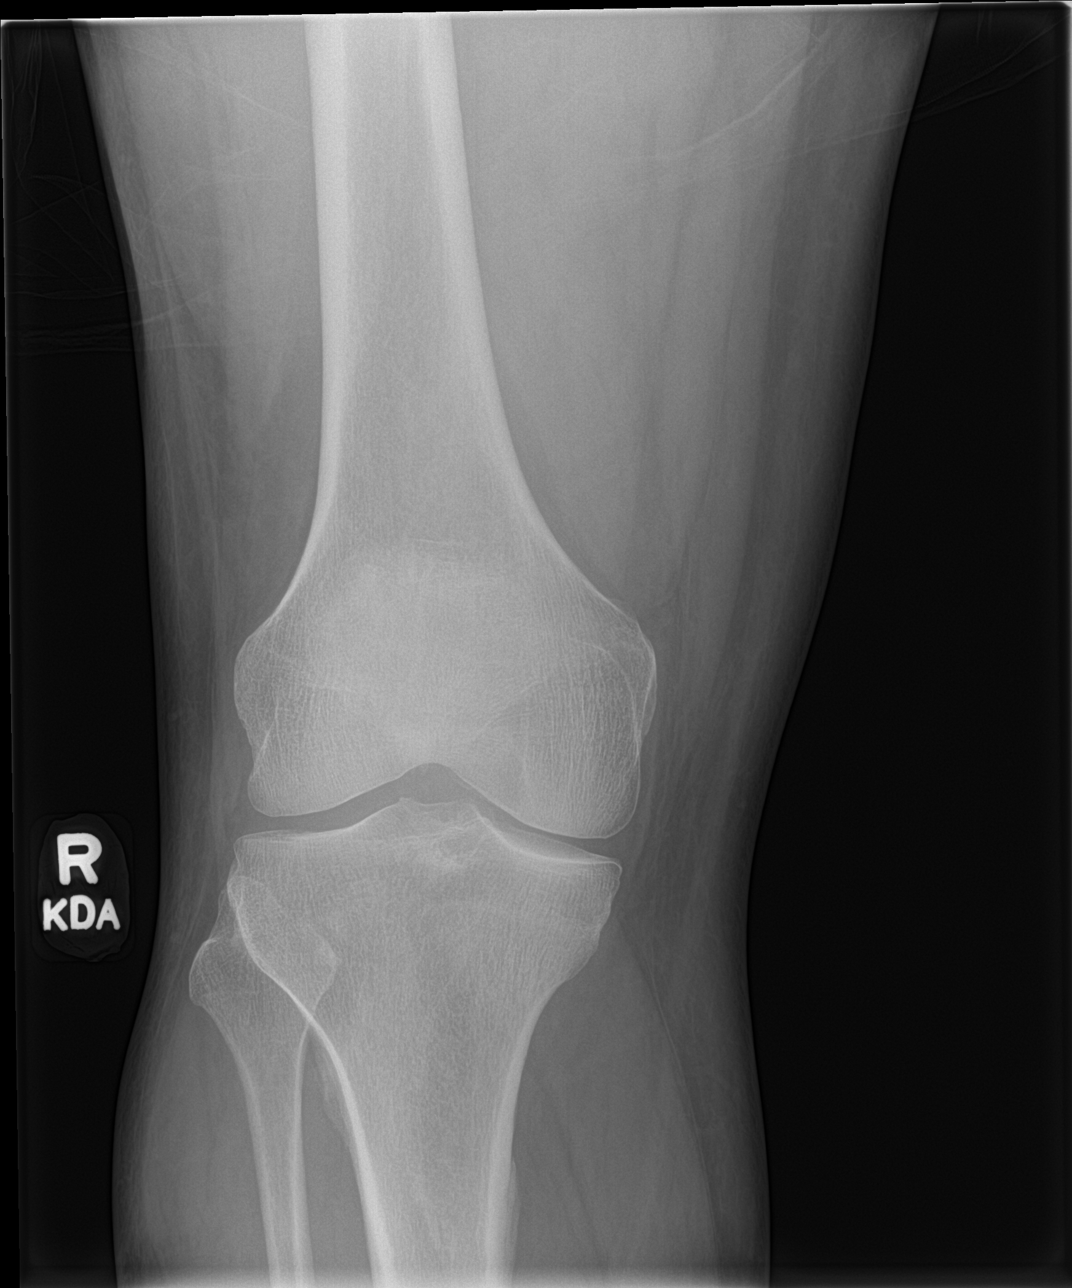

[knee lat]
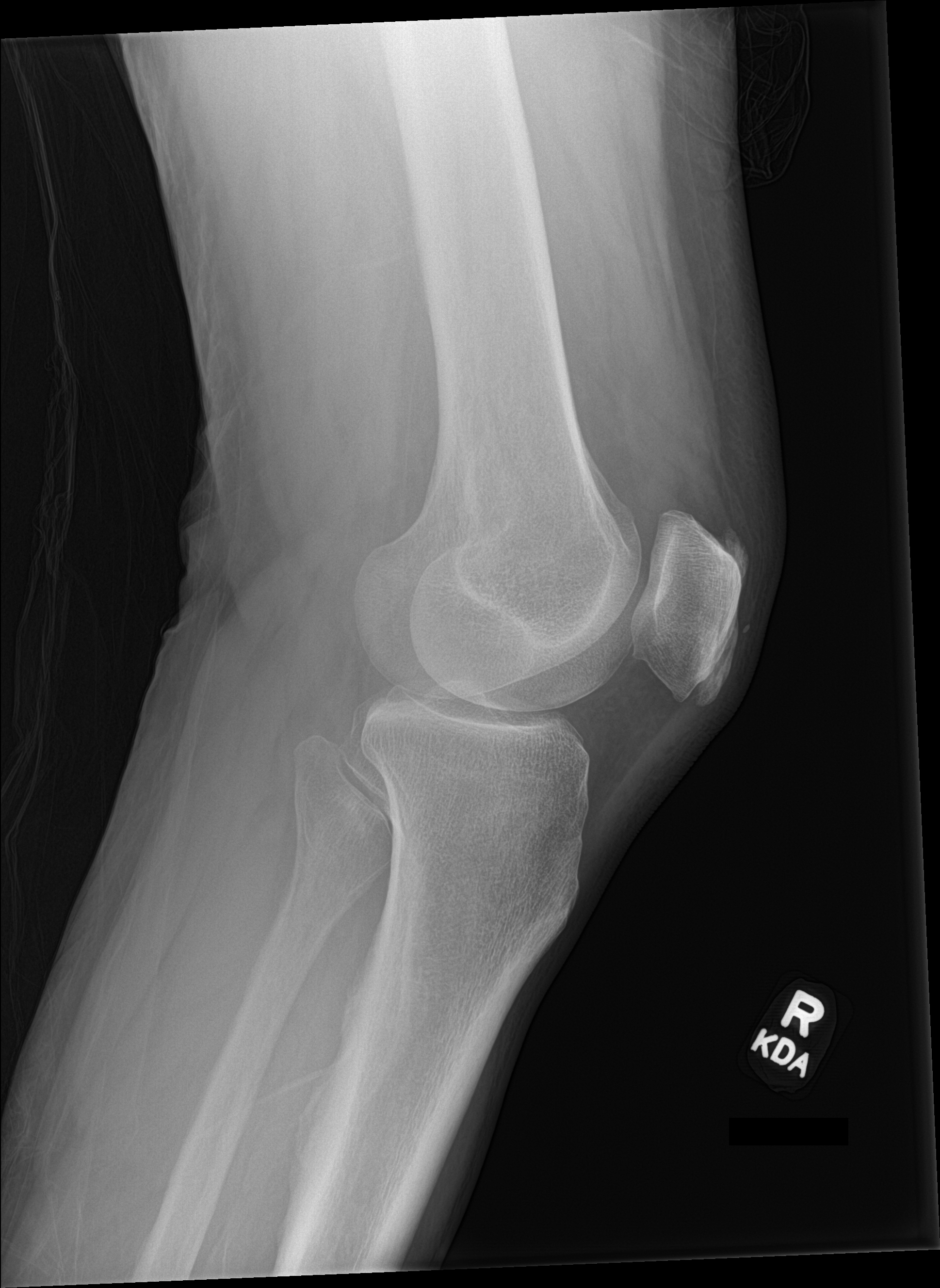

[knee obl]
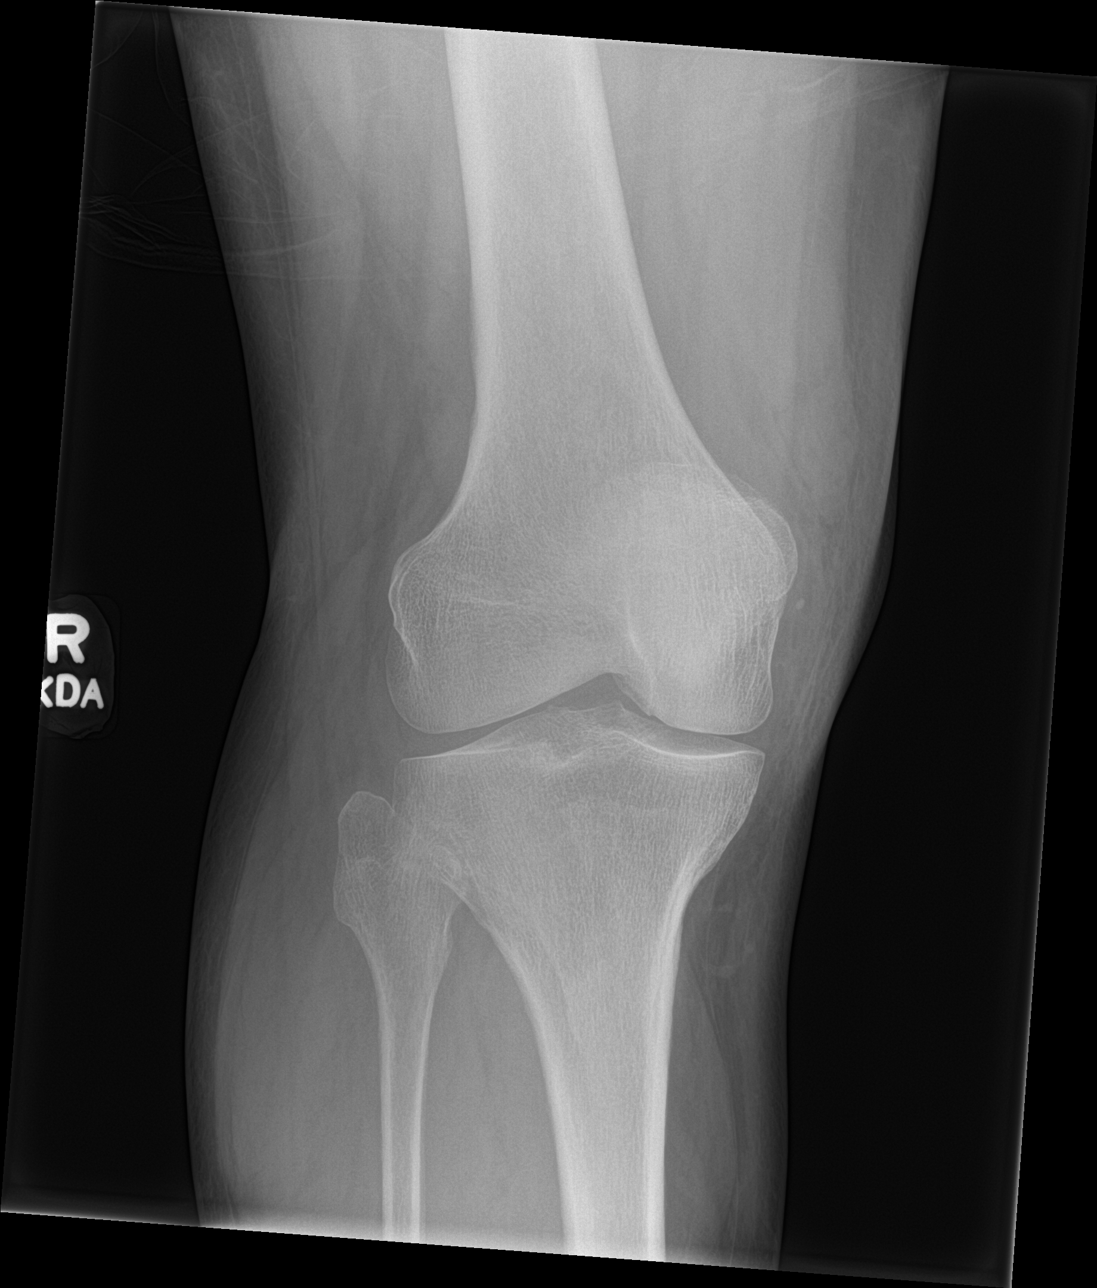

[4 of 4 positions shown; findings below may reference images not displayed]

FINDINGS: No evidence of fracture, dislocation, or definite joint effusion. No
degenerative margin spurring or detected joint narrowing. Extensor
enthesophytes.
IMPRESSION: No acute or focal finding.

## 2021-06-15 MED ORDER — LEVOTHYROXINE SODIUM 150 MCG PO TABS
150.0000 ug | ORAL_TABLET | Freq: Every day | ORAL | 0 refills | Status: DC
  Filled 2021-06-15 – 2021-06-18 (×2): qty 90, 90d supply, fill #0

## 2021-06-18 ENCOUNTER — Other Ambulatory Visit (HOSPITAL_COMMUNITY): Payer: Self-pay

## 2021-06-20 ENCOUNTER — Other Ambulatory Visit (HOSPITAL_COMMUNITY): Payer: Self-pay

## 2021-07-18 ENCOUNTER — Other Ambulatory Visit (HOSPITAL_COMMUNITY): Payer: Self-pay

## 2021-07-18 MED ORDER — CARESTART COVID-19 HOME TEST VI KIT
PACK | 0 refills | Status: DC
  Filled 2021-07-18: qty 4, 4d supply, fill #0

## 2021-07-21 ENCOUNTER — Telehealth: Payer: Self-pay | Admitting: Primary Care

## 2021-07-21 NOTE — Telephone Encounter (Signed)
Called and spoke to patient. Advised him to call [employer] as that is where he got his cpap machine from to see if he has a machine that has been recalled. States he will call them when he gets home and can look a the serial number and info on the machine.   Patient states he has had a dry cough that is persistent. He feels he has mucus that he needs to cough up but is having trouble. States he has been wheezing. No fevers. Has been going on for approx. 1 week.   Patient would like to know if something can be called in for him.   *to note: he is at an eye appt and would like to be messaged back on mychart to let him know if something has been called in to Christopher Sanchez NP please advise. Thanks!

## 2021-07-21 NOTE — Telephone Encounter (Signed)
Spoke with the pt  He feels like there is an issue with his CPAP causing the cough  I set him up for video visit with [facility] for tomorrow at 4:30

## 2021-07-21 NOTE — Telephone Encounter (Signed)
LMTCB

## 2021-07-21 NOTE — Telephone Encounter (Signed)
Pt returning call from [facility] encounter 12/1. Message states: "Good Morning I am writing because I am concerned that my machine is making me sick. I am wheezing and it feels like I have something in my lungs that I can not cough up. I have a constant cough.  I have the Dream Station. I have seen that there are recalls on certain machines. Do I have one that needs to be recalled?"   Routing as urgent as pt describes being sick.

## 2021-07-21 NOTE — Telephone Encounter (Signed)
Signed      Called and spoke to patient. Advised him to call [employer] as that is where he got his cpap machine from to see if he has a machine that has been recalled. States he will call them when he gets home and can look a the serial number and info on the machine.    Patient states he has had a dry cough that is persistent. He feels he has mucus that he needs to cough up but is having trouble. States he has been wheezing. No fevers. Has been going on for approx. 1 week.    Patient would like to know if something can be called in for him.    *to note: he is at an eye appt and would like to be messaged back on mychart to let him know if something has been called in to [facility] NP please advise. Thanks!         Rerouting to Dr. Elsworth Soho as Geraldo Pitter is not in office this afternoon

## 2021-07-22 ENCOUNTER — Other Ambulatory Visit: Payer: Self-pay

## 2021-07-22 ENCOUNTER — Telehealth (INDEPENDENT_AMBULATORY_CARE_PROVIDER_SITE_OTHER): Payer: No Typology Code available for payment source | Admitting: Primary Care

## 2021-07-22 ENCOUNTER — Other Ambulatory Visit (HOSPITAL_COMMUNITY): Payer: Self-pay

## 2021-07-22 DIAGNOSIS — J209 Acute bronchitis, unspecified: Secondary | ICD-10-CM | POA: Diagnosis not present

## 2021-07-22 DIAGNOSIS — G4733 Obstructive sleep apnea (adult) (pediatric): Secondary | ICD-10-CM

## 2021-07-22 DIAGNOSIS — Z9989 Dependence on other enabling machines and devices: Secondary | ICD-10-CM

## 2021-07-22 MED ORDER — DOXYCYCLINE HYCLATE 100 MG PO TABS
100.0000 mg | ORAL_TABLET | Freq: Two times a day (BID) | ORAL | 0 refills | Status: DC
Start: 2021-07-22 — End: 2021-09-02
  Filled 2021-07-22: qty 14, 7d supply, fill #0

## 2021-07-22 MED ORDER — PREDNISONE 10 MG PO TABS
ORAL_TABLET | ORAL | 0 refills | Status: AC
Start: 2021-07-22 — End: 2021-08-01
  Filled 2021-07-22: qty 15, 10d supply, fill #0

## 2021-07-22 NOTE — Progress Notes (Signed)
Virtual Visit via Video Note  I connected with Christopher Howard on 07/22/21 at  4:30 PM EST by a video enabled telemedicine application and verified that I am speaking with the correct person using two identifiers.  Location: Patient: Home Provider: Office   I discussed the limitations of evaluation and management by telemedicine and the availability of in person appointments. The patient expressed understanding and agreed to proceed.  History of Present Illness: 55 year old male, former smoker quit in 2014 (5-pack-year history).  Past medical history significant for hypertension, OSA, GERD, hypothyroidism, hyperlipidemia.  Patient of Dr. Halford Chessman. HST 2016 showed severe OSA, average AHI 30/hr with SpO2 low 70%. Maintained on auto CPAP.   Previous [facility] pulmonary encounter: 07/24/19- Dr. Halford Chessman He has been doing well with CPAP.  Tried nasal mask, but pressure felt too high in his sinuses.  Changed back to full face mask, and doing better.  Not having sinus congestion, sore throat, dry mouth, or aerophagia. Has noticed weight gain.  Had thyroid medication adjusted by PCP.  12/20/2020 Patient contacted today for follow-up OSA/televisit. He is doing well, no issues with mask fit or pressure setting. He is compliant with CPAP use. He works at night, sometimes doesn't finish until later which accounts for the three times he did not wear his mask this past month. Using full face mask, he was unable to use nasal mask d/t sleeping with his mouth open. He bought a so clean device. He needs CPAP supplies renewed.  DME [employer].    07/22/2021 Interim hx  Patient contacted today by virtual visit for acute OV. He reports having cough related to CPAP. Cough started 3 weeks ago. Associated sob/wheezing. Cough was initially dry until he starting taking OTC mucus relief. His cough is worse at night, thinks he may have got water into his lungs from his CPAP. He brought machine into DME company and his phillips CPAP was one of  the recalled machines. He had been using So Clean device but has since stopped using.   Observations/Objective:  - Appears well; Mild-moderate dry cough. No overt shortness of breath, wheezing   Sleep test: HST 11/18/14 >> AHI 30, SpO2 low 70%  Auto CPAP 06/23/19 o 07/23/19 >> used on 30 of 30 nights with average 7 hrs 43 min. Average AHI 2.9 with 90% setting of 13 cm H2O.  Auto CPAP 11/19/20-12/19/20 >> used 27/30 nights with average 8 hours.  Average AHI 4.0 with 90% setting 13.5cm h20   Assessment and Plan:  OSA: - Patient reports cough with CPAP use  - Needs new CPAP machine d/t phillips recall and machine being >70 year old - Continue PAP pressure 13cm h20 - Advised against SoClean device use  Acute bronchitis: - Possible mold exposure from CPAP - Sending in course Doxycyline 100mg  twice daily x 7 days and prednisone taper (20mg  x 5 days; 10mg  x 5 days) - If not improving needs OV in office and CXR   Follow Up Instructions:   - 3 months with Dr. Halford Chessman or sooner if not better   I discussed the assessment and treatment plan with the patient. The patient was provided an opportunity to ask questions and all were answered. The patient agreed with the plan and demonstrated an understanding of the instructions.   The patient was advised to call back or seek an in-person evaluation if the symptoms worsen or if the condition fails to improve as anticipated.  I provided 22 minutes of non-face-to-face time during this encounter.  Martyn Ehrich, NP

## 2021-07-22 NOTE — Progress Notes (Signed)
Reviewed and agree with assessment/plan.   Chesley Mires, MD [facility] Pulmonary/Critical Care 07/22/2021, 5:17 PM Pager:  [phone]

## 2021-07-22 NOTE — Patient Instructions (Signed)
Doxycycline and prednisone taper as directed Continue over the counter mucus relief Follow up next week if not better with office visit/CXR Putting in order for new CPAP machine, avoid SoClean device

## 2021-08-17 NOTE — Telephone Encounter (Signed)
He needs OV in office with CXR prior

## 2021-08-17 NOTE — Telephone Encounter (Signed)
BW please advise.  Last seen by video visit on 12/02 by BW.  Thanks  Hello I was feeling better. Took all medication. Hardin Negus finally sent a replacement machine 5 days ago. The symptoms are returning.  Wheezing,  coughing,  and runny nose. I started taking Severe Mucinex. It is working. Should I get a other prescription?   Thank you  Christopher Howard

## 2021-08-25 ENCOUNTER — Ambulatory Visit: Payer: No Typology Code available for payment source | Admitting: Primary Care

## 2021-08-29 ENCOUNTER — Telehealth: Payer: Self-pay | Admitting: Pulmonary Disease

## 2021-08-29 ENCOUNTER — Ambulatory Visit: Payer: No Typology Code available for payment source

## 2021-08-29 DIAGNOSIS — R062 Wheezing: Secondary | ICD-10-CM

## 2021-08-29 NOTE — Telephone Encounter (Signed)
Spoke with pt who states that he is still wheezing and coughing. 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 Pt scheduled with Tammy Parret on 09/02/21 at 2:30. Pt was asked to arrive at 2:15 so CXR could be done prior to [place]. Pt stated understanding. CXR order placed. Nothing further needed at this time.

## 2021-09-02 ENCOUNTER — Other Ambulatory Visit: Payer: Self-pay

## 2021-09-02 ENCOUNTER — Other Ambulatory Visit (HOSPITAL_COMMUNITY): Payer: Self-pay

## 2021-09-02 ENCOUNTER — Encounter: Payer: Self-pay | Admitting: Adult Health

## 2021-09-02 ENCOUNTER — Ambulatory Visit (INDEPENDENT_AMBULATORY_CARE_PROVIDER_SITE_OTHER): Payer: No Typology Code available for payment source | Admitting: Adult Health

## 2021-09-02 ENCOUNTER — Ambulatory Visit (INDEPENDENT_AMBULATORY_CARE_PROVIDER_SITE_OTHER): Payer: No Typology Code available for payment source

## 2021-09-02 DIAGNOSIS — R062 Wheezing: Secondary | ICD-10-CM

## 2021-09-02 DIAGNOSIS — J42 Unspecified chronic bronchitis: Secondary | ICD-10-CM | POA: Insufficient documentation

## 2021-09-02 DIAGNOSIS — J410 Simple chronic bronchitis: Secondary | ICD-10-CM | POA: Diagnosis not present

## 2021-09-02 DIAGNOSIS — G4733 Obstructive sleep apnea (adult) (pediatric): Secondary | ICD-10-CM

## 2021-09-02 MED ORDER — ALBUTEROL SULFATE HFA 108 (90 BASE) MCG/ACT IN AERS
1.0000 | INHALATION_SPRAY | Freq: Four times a day (QID) | RESPIRATORY_TRACT | 2 refills | Status: DC | PRN
  Filled 2021-09-02: qty 8.5, 25d supply, fill #0

## 2021-09-02 NOTE — Progress Notes (Signed)
CXR was normal, lungs clear. NO active cardiopulmonary disease

## 2021-09-02 NOTE — Progress Notes (Signed)
Reviewed and agree with assessment/plan.   Chesley Mires, MD [facility] Pulmonary/Critical Care 09/02/2021, 3:16 PM Pager:  [phone]

## 2021-09-02 NOTE — Assessment & Plan Note (Signed)
Patient with ongoing post bronchitic cough and wheezing. Will treat for triggers including postnasal drainage.  Add in Zyrtec.  Delsym for cough control. Albuterol for possible component of reactive airways. Patient is a former smoker.  Check PFTs on return.  Chest x-ray today is clear  Plan  Patient Instructions  Albuterol inhaler 1-2 puffs every 6hrs as needed  Begin Zyrtec 10mg  At bedtime   Delsym 2 tsp Twice daily for cough As needed   Return in 4-6 weeks with PFTs with Dr. Halford Chessman  and As needed    Continue on CPAP At bedtime   Work on healthy weight  Do not drive if sleepy  Contact DME regarding humidity setting.

## 2021-09-02 NOTE — Progress Notes (Signed)
$'@Patient'o$  ID: Christopher Howard, male    DOB: 09-Sep-1965, 56 y.o.   MRN: [id number]  Chief Complaint  Patient presents with   Follow-up    Referring provider: Laurey Morale, MD  HPI: 56 year old male former smoker followed for obstructive sleep apnea  TEST/EVENTS :  HST 11/18/14 >> AHI 30, SpO2 low 70%  09/02/2021 Acute OV: Cough and wheezing  Patient presents for an acute office visit.  Patient was seen last visit for an acute office visit virtually with acute bronchitis.  He was given a course of doxycycline and prednisone taper. Says he got better but symptoms returned shortly after finishing .  Since last visit patient says he is continue to have ongoing cough and wheezing.  Chest x-ray today shows clear lungs. Former smoker. Exercises few times a week -spin class.  Denies any chest pain, orthopnea, discolored mucus or fever.  Has recently gotten new CPAP machine. Wears CPAP each night.  Complains that he has a lot of new condensation in mask/tubing.  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 Loanne Drilling    Pneumonia 12/2013   Seasonal allergies     Tobacco History: Social History   Tobacco Use  Smoking Status Former   Packs/day: 0.25   Years: 20.00   Pack years: 5.00   Types: Cigarettes   Quit date: 08/22/2011   Years since quitting: 10.0   Smokeless Tobacco Never   Counseling given: Not Answered   Outpatient Medications Prior to Visit  Medication Sig Dispense Refill   aspirin EC 81 MG tablet Take 1 tablet (81 mg total) by mouth daily. (Patient taking differently: Take 81 mg by mouth daily as needed.) 1 tablet 0   hydrochlorothiazide (HYDRODIURIL) 25 MG tablet Take 1 tablet (25 mg total) by mouth daily. 90 tablet 3   levothyroxine (SYNTHROID) 150 MCG tablet Take 1 tablet (150 mcg total) by mouth daily. 90 tablet 0   COVID-19 At Home Antigen Test (CARESTART COVID-19 HOME TEST) KIT Use as directed. 4 each 0   doxycycline (VIBRA-TABS) 100 MG tablet Take 1 tablet (100 mg total) by mouth 2 (two) times daily. 14 tablet 0   No facility-administered medications prior to visit.     Review of Systems:   Constitutional:   No  weight loss, night sweats,  Fevers, chills, fatigue, or  lassitude.  HEENT:   No headaches,  Difficulty swallowing,  Tooth/dental problems, or  Sore throat,                No sneezing, itching, ear ache,  +nasal congestion, post nasal drip,   CV:  No chest pain,  Orthopnea, PND, swelling in lower extremities, anasarca, dizziness, palpitations, syncope.   GI  No heartburn, indigestion, abdominal pain, nausea, vomiting, diarrhea, change in bowel habits, loss of appetite, bloody stools.   Resp:  No  excess mucus, no productive cough,  No non-productive cough,  No coughing up of blood.  No change in color of mucus.  No wheezing.  No chest wall deformity  Skin: no rash or lesions.  GU: no dysuria, change in color of urine, no urgency or frequency.  No flank pain, no hematuria   MS:  No joint pain or swelling.  No decreased range of motion.  No back pain.    Physical Exam  BP 132/78 (BP Location: Left Arm, Patient Position: Sitting, Cuff Size: Large)    Pulse 65    Temp 98.8 F (37.1 C) (Oral)    Ht 5' 9.5" (1.765 m)    Wt 280 lb 1.6 oz (127.1 kg)    SpO2 97% Comment: RA   BMI 40.77 kg/m   GEN: A/Ox3;  pleasant , NAD, well nourished    HEENT:  [place]/AT,   NOSE-clear, THROAT-clear, no lesions, no postnasal drip or exudate noted.   NECK:  Supple w/ fair ROM; no JVD; normal carotid impulses w/o bruits; no thyromegaly or nodules palpated; no lymphadenopathy.    RESP  Clear  P & A; w/o, wheezes/ rales/ or rhonchi. no accessory muscle use, no dullness to percussion  CARD:  RRR, no m/r/g, no peripheral edema, pulses intact, no cyanosis or clubbing.  GI:   Soft & nt; nml bowel sounds; no organomegaly or masses detected.   Musco: Warm bil, no deformities or joint swelling noted.   Neuro: alert, no focal deficits noted.    Skin: Warm, no lesions or rashes    Lab Results:    BMET   BNP No results found for: BNP  ProBNP No results found for: PROBNP  Imaging: DG Chest 2 View  Result Date: 09/02/2021 CLINICAL DATA:  Wheezing. EXAM: CHEST - 2 VIEW COMPARISON:  Chest x-ray dated March 04, 2014. FINDINGS: The heart size and mediastinal contours are within normal limits. Both lungs are clear. The visualized skeletal structures are unremarkable. IMPRESSION: No active cardiopulmonary disease. Electronically Signed   By: Titus Dubin M.D.   On: 09/02/2021 14:40      No flowsheet data found.  No results found for: NITRICOXIDE      Assessment & Plan:   OSA (obstructive sleep apnea) Excellent control compliance on nocturnal CPAP. Would contact DME to help with humidity issues  Plan  Patient Instructions  Albuterol inhaler 1-2 puffs every 6hrs as needed  Begin Zyrtec $RemoveBefo'10mg'[id number]$  At bedtime   Delsym 2 tsp Twice daily for cough As needed   Return in 4-6 weeks with PFTs with Dr. Halford Chessman  and As needed    Continue on CPAP At bedtime   Work on healthy weight  Do not drive if sleepy  Contact DME regarding humidity setting.        Chronic bronchitis ([place]) Patient with ongoing post bronchitic cough and wheezing. Will treat for triggers including postnasal drainage.  Add in Zyrtec.   Delsym for cough control. Albuterol for possible component of reactive airways. Patient is a former smoker.  Check PFTs on return.  Chest x-ray today is clear  Plan  Patient Instructions  Albuterol inhaler 1-2 puffs every 6hrs as needed  Begin Zyrtec $RemoveBefo'10mg'[id number]$  At bedtime   Delsym 2 tsp Twice daily for cough As needed   Return in 4-6 weeks with PFTs with Dr. Halford Chessman  and As needed    Continue on CPAP At bedtime   Work on healthy weight  Do not drive if sleepy  Contact DME  regarding humidity setting.          Rexene Edison, NP 09/02/2021

## 2021-09-02 NOTE — Patient Instructions (Addendum)
Albuterol inhaler 1-2 puffs every 6hrs as needed  Begin Zyrtec 10mg  At bedtime   Delsym 2 tsp Twice daily for cough As needed   Return in 4-6 weeks with PFTs with Dr. Halford Chessman  and As needed    Continue on CPAP At bedtime   Work on healthy weight  Do not drive if sleepy  Contact DME regarding humidity setting.

## 2021-09-02 NOTE — Addendum Note (Signed)
Addended by: Lorretta Harp on: 09/02/2021 05:04 PM   Modules accepted: Orders

## 2021-09-02 NOTE — Assessment & Plan Note (Signed)
Excellent control compliance on nocturnal CPAP. Would contact DME to help with humidity issues  Plan  Patient Instructions  Albuterol inhaler 1-2 puffs every 6hrs as needed  Begin Zyrtec 10mg  At bedtime   Delsym 2 tsp Twice daily for cough As needed   Return in 4-6 weeks with PFTs with Dr. Halford Chessman  and As needed    Continue on CPAP At bedtime   Work on healthy weight  Do not drive if sleepy  Contact DME regarding humidity setting.

## 2021-09-21 ENCOUNTER — Other Ambulatory Visit: Payer: Self-pay | Admitting: Family Medicine

## 2021-09-21 ENCOUNTER — Other Ambulatory Visit (HOSPITAL_COMMUNITY): Payer: Self-pay

## 2021-09-21 MED ORDER — LEVOTHYROXINE SODIUM 150 MCG PO TABS
150.0000 ug | ORAL_TABLET | Freq: Every day | ORAL | 0 refills | Status: DC
  Filled 2021-09-21: qty 90, 90d supply, fill #0

## 2021-10-11 ENCOUNTER — Other Ambulatory Visit: Payer: Self-pay

## 2021-10-11 ENCOUNTER — Ambulatory Visit (INDEPENDENT_AMBULATORY_CARE_PROVIDER_SITE_OTHER): Payer: No Typology Code available for payment source | Admitting: Pulmonary Disease

## 2021-10-11 ENCOUNTER — Other Ambulatory Visit (HOSPITAL_COMMUNITY): Payer: Self-pay

## 2021-10-11 ENCOUNTER — Encounter: Payer: Self-pay | Admitting: Pulmonary Disease

## 2021-10-11 ENCOUNTER — Telehealth: Payer: Self-pay | Admitting: Pulmonary Disease

## 2021-10-11 VITALS — BP 126/78 | HR 64 | Temp 98.0°F | Ht 69.5 in | Wt 274.0 lb

## 2021-10-11 DIAGNOSIS — G4733 Obstructive sleep apnea (adult) (pediatric): Secondary | ICD-10-CM | POA: Diagnosis not present

## 2021-10-11 DIAGNOSIS — J410 Simple chronic bronchitis: Secondary | ICD-10-CM

## 2021-10-11 DIAGNOSIS — J342 Deviated nasal septum: Secondary | ICD-10-CM

## 2021-10-11 DIAGNOSIS — J310 Chronic rhinitis: Secondary | ICD-10-CM

## 2021-10-11 LAB — PULMONARY FUNCTION TEST
DL/VA % pred: 98 %
DL/VA: 4.19 ml/min/mmHg/L
DLCO cor % pred: 102 %
DLCO cor: 28.3 ml/min/mmHg
DLCO unc % pred: 102 %
DLCO unc: 28.3 ml/min/mmHg
FEF 25-75 Post: 3.02 L/sec
FEF 25-75 Pre: 2.23 L/sec
FEF2575-%Change-Post: 35 %
FEF2575-%Pred-Post: 102 %
FEF2575-%Pred-Pre: 75 %
FEV1-%Change-Post: 8 %
FEV1-%Pred-Post: 108 %
FEV1-%Pred-Pre: 100 %
FEV1-Post: 3.38 L
FEV1-Pre: 3.12 L
FEV1FVC-%Change-Post: 7 %
FEV1FVC-%Pred-Pre: 93 %
FEV6-%Change-Post: 1 %
FEV6-%Pred-Post: 112 %
FEV6-%Pred-Pre: 110 %
FEV6-Post: 4.3 L
FEV6-Pre: 4.24 L
FEV6FVC-%Change-Post: 0 %
FEV6FVC-%Pred-Post: 103 %
FEV6FVC-%Pred-Pre: 103 %
FVC-%Change-Post: 1 %
FVC-%Pred-Post: 108 %
FVC-%Pred-Pre: 106 %
FVC-Post: 4.3 L
FVC-Pre: 4.25 L
Post FEV1/FVC ratio: 79 %
Post FEV6/FVC ratio: 100 %
Pre FEV1/FVC ratio: 73 %
Pre FEV6/FVC Ratio: 100 %
RV % pred: 113 %
RV: 2.46 L
TLC % pred: 99 %
TLC: 6.88 L

## 2021-10-11 MED ORDER — FLUTICASONE PROPIONATE 50 MCG/ACT NA SUSP
1.0000 | Freq: Every day | NASAL | 2 refills | Status: AC
  Filled 2021-10-11: qty 16, 30d supply, fill #0

## 2021-10-11 MED ORDER — AZELASTINE HCL 0.15 % NA SOLN
1.0000 | Freq: Two times a day (BID) | NASAL | 3 refills | Status: DC
  Filled 2021-10-11: qty 30, 90d supply, fill #0

## 2021-10-11 NOTE — Patient Instructions (Signed)
Will get a copy of your CPAP report and call with results  Astepro one spray in each nostril in the morning  Nasal irrigation, then flonase 1 spray in each nostril, and then astepro 1 spray in each nostril at night  Follow up in 3 months

## 2021-10-11 NOTE — Patient Instructions (Signed)
Full PFT performed today. °

## 2021-10-11 NOTE — Progress Notes (Signed)
Full PFT performed today. °

## 2021-10-11 NOTE — Telephone Encounter (Signed)
Auto CPAP 09/11/21 to 10/10/21 >> used on 30 of 30 nights with average 7 hrs 50 min.  Average AHI 3.6 with median CPAP 11 and 90 th percentile CPAP 14 cm H2O.  Please let him know his CPAP report shows very good control on current set up.

## 2021-10-11 NOTE — Progress Notes (Signed)
[facility]  Chief Complaint  Patient presents with   Follow-up    He reports he is doing well with CPAP and denies any short of breath, cough or wheezing.     Past Surgical History:  He  has a past surgical history that includes Thyroidectomy; Wisdom tooth extraction; laser surgery mouth (07/2013 & 08/2013); and Colonoscopy (06-26-14).  Past Medical History:  Allergies, Pneumonia, Hypothyroidism, HTN, HLD, GERD  Constitutional:  BP 126/78 (BP Location: Left Arm, Patient Position: Sitting, Cuff Size: Normal)    Pulse 64    Temp 98 F (36.7 C) (Oral)    Ht 5' 9.5" (1.765 m)    Wt 274 lb (124.3 kg)    SpO2 100%    BMI 39.88 kg/m   Brief Summary:  Christopher Howard is a 56 y.o. male former smoker with obstructive sleep apnea and asthma.      Subjective:   I last saw him in 2020.  Has been seen by Derl Barrow and Tammy Parrett in between.  Seen in January for a cough and wheezing.  PFT today showed small airway obstruction with positive bronchodilator response.  Chest xray from 09/02/21 was normal.  He has persistent sinus congestion with post nasal drip.  Make it hard for him to use CPAP.  He is not having as much cough or chest congestion anymore.  He thinks his chest symptoms were from using his old CPAP machine which he thinks had mold in it.  Physical Exam:   Appearance - well kempt   ENMT - no sinus tenderness, no oral exudate, no LAN, Mallampati 4 airway, no stridor, deviated nasal septum  Respiratory - equal breath sounds bilaterally, no wheezing or rales  CV - s1s2 regular rate and rhythm, no murmurs  Ext - no clubbing, no edema  Skin - no rashes  Psych - normal mood and affect   Pulmonary testing:  PFT 10/12/19 >> FEV1 3.38 (108%), FEV1% 79, TLC 6.88 (99%), DLCO 102%  Sleep Tests:  HST 11/18/14 >> AHI 30, SpO2 low 70% Auto CPAP 06/23/19 o 07/23/19 >> used on 30 of 30 nights with average 7 hrs 43 min.  Average AHI 2.9  with 90% setting of 13 cm H2O.  Social History:  He  reports that he quit smoking about 10 years ago. His smoking use included cigarettes. He has a 5.00 pack-year smoking history. He has never used smokeless tobacco. He reports that he does not drink alcohol and does not use drugs.  Family History:  His family history includes Arthritis in an other family member; Cancer in an other family member; Diabetes in his maternal uncle and another family member; Heart attack in his father; Heart disease in an other family member; Kidney disease in an other family member; Stroke in his mother and another family member; Throat cancer in his father.     Assessment/Plan:   Chronic rhinitis with post nasal drip and deviated nasal septum. - will have him use nasal irrigation and flonase at night, and astepro bid - if his symptoms persist then he might need referral to ENT  Wheezing. - mild have been from asthmatic bronchitis related to poor air quality from old CPAP machine - not as much of an issue at present - prn albuterol  Obstructive sleep apnea. - he is compliant with CPAP and reports benefit from therapy - he uses [facility] for his DME - will call him with results of his new  auto CPAP download  Time Spent Involved in Patient Care on Day of Examination:  37 minutes  Follow up:   Patient Instructions  Will get a copy of your CPAP report and call with results  Astepro one spray in each nostril in the morning  Nasal irrigation, then flonase 1 spray in each nostril, and then astepro 1 spray in each nostril at night  Follow up in 3 months  Medication List:   Allergies as of 10/11/2021       Reactions   Ketoconazole Other (See Comments)   Pain all over body        Medication List        Accurate as of October 11, 2021  3:35 PM. If you have any questions, ask your nurse or doctor.          albuterol 108 (90 Base) MCG/ACT inhaler Commonly known as: VENTOLIN HFA Inhale 1-2  puffs into the lungs every 6 (six) hours as needed.   aspirin EC 81 MG tablet Take 1 tablet (81 mg total) by mouth daily. What changed:  when to take this reasons to take this   Azelastine HCl 0.15 % Soln Commonly known as: Astepro Place 1 spray into the nose 2 (two) times daily. Started by: Chesley Mires, MD   fluticasone 50 MCG/ACT nasal spray Commonly known as: FLONASE Place 1 spray into both nostrils daily. Started by: Chesley Mires, MD   hydrochlorothiazide 25 MG tablet Commonly known as: HYDRODIURIL Take 1 tablet (25 mg total) by mouth daily.   levothyroxine 150 MCG tablet Commonly known as: SYNTHROID Take 1 tablet (150 mcg total) by mouth daily.        Signature:  Chesley Mires, MD [place] Pager - [phone] 10/11/2021, 3:35 PM

## 2021-10-12 NOTE — Telephone Encounter (Signed)
Called and left detailed message on cell phone VM (ok per DPR). Advised patient to call back if he had questions about VM. Nothing further needed.

## 2021-10-13 ENCOUNTER — Other Ambulatory Visit (HOSPITAL_COMMUNITY): Payer: Self-pay

## 2021-10-24 ENCOUNTER — Other Ambulatory Visit (HOSPITAL_COMMUNITY): Payer: Self-pay

## 2021-10-24 MED ORDER — HYDROCHLOROTHIAZIDE 25 MG PO TABS
ORAL_TABLET | ORAL | 1 refills | Status: DC
  Filled 2021-10-24: qty 90, 90d supply, fill #0

## 2021-10-25 ENCOUNTER — Other Ambulatory Visit (HOSPITAL_COMMUNITY): Payer: Self-pay

## 2021-10-25 MED ORDER — METFORMIN HCL ER 500 MG PO TB24
ORAL_TABLET | ORAL | 4 refills | Status: DC
  Filled 2021-10-25: qty 180, 90d supply, fill #0
  Filled 2022-01-17: qty 180, 90d supply, fill #1
  Filled 2022-04-22: qty 180, 90d supply, fill #2
  Filled 2022-07-22: qty 180, 90d supply, fill #3

## 2021-10-25 MED ORDER — LEVOTHYROXINE SODIUM 137 MCG PO TABS
ORAL_TABLET | ORAL | 11 refills | Status: DC
  Filled 2021-10-25: qty 30, 30d supply, fill #0
  Filled 2021-11-21: qty 30, 30d supply, fill #1

## 2021-11-21 ENCOUNTER — Other Ambulatory Visit (HOSPITAL_COMMUNITY): Payer: Self-pay

## 2021-12-19 ENCOUNTER — Encounter: Payer: Self-pay | Admitting: Pulmonary Disease

## 2021-12-19 ENCOUNTER — Ambulatory Visit (INDEPENDENT_AMBULATORY_CARE_PROVIDER_SITE_OTHER): Payer: No Typology Code available for payment source | Admitting: Pulmonary Disease

## 2021-12-19 ENCOUNTER — Other Ambulatory Visit (HOSPITAL_COMMUNITY): Payer: Self-pay

## 2021-12-19 VITALS — BP 116/74 | HR 70 | Temp 98.2°F | Ht 69.0 in | Wt 276.8 lb

## 2021-12-19 DIAGNOSIS — G4733 Obstructive sleep apnea (adult) (pediatric): Secondary | ICD-10-CM | POA: Diagnosis not present

## 2021-12-19 DIAGNOSIS — J310 Chronic rhinitis: Secondary | ICD-10-CM | POA: Diagnosis not present

## 2021-12-19 DIAGNOSIS — J342 Deviated nasal septum: Secondary | ICD-10-CM | POA: Diagnosis not present

## 2021-12-19 DIAGNOSIS — R062 Wheezing: Secondary | ICD-10-CM | POA: Diagnosis not present

## 2021-12-19 MED ORDER — AZELASTINE HCL 0.15 % NA SOLN
1.0000 | Freq: Two times a day (BID) | NASAL | 3 refills | Status: DC
  Filled 2021-12-19: qty 30, 90d supply, fill #0

## 2021-12-19 MED ORDER — ALBUTEROL SULFATE HFA 108 (90 BASE) MCG/ACT IN AERS
1.0000 | INHALATION_SPRAY | Freq: Four times a day (QID) | RESPIRATORY_TRACT | 2 refills | Status: DC | PRN
Start: 2021-12-19 — End: 2023-01-22
  Filled 2021-12-19: qty 6.7, 25d supply, fill #0
  Filled 2022-11-07: qty 6.7, 25d supply, fill #1

## 2021-12-19 NOTE — Progress Notes (Signed)
? ?[facility] ? ?Chief Complaint  ?Patient presents with  ? Follow-up  ?  Patient is doing good, no concerns  ? ? ?Past Surgical History:  ?He  has a past surgical history that includes Thyroidectomy; Wisdom tooth extraction; laser surgery mouth (07/2013 & 08/2013); and Colonoscopy (06-26-14). ? ?Past Medical History:  ?Allergies, Pneumonia, Hypothyroidism, HTN, HLD, GERD ? ?Constitutional:  ?BP 116/74 (BP Location: Left Arm, Patient Position: Sitting, Cuff Size: Large)   Pulse 70   Temp 98.2 ?F (36.8 ?C) (Oral)   Ht '5\' 9"'$  (1.753 m)   Wt 276 lb 12.8 oz (125.6 kg)   SpO2 95%   BMI 40.88 kg/m?  ? ?Brief Summary:  ?Christopher Howard is a 56 y.o. male former smoker with obstructive sleep apnea and asthma. ?  ? ? ? ?Subjective:  ? ?Cough and wheeze are better.  Needs refill for astepro.  Using flonase and nasal irrigation.  Uses albuterol intermittently. ? ?Has new Respironics machine.  Pressure okay.  Using full face mask.  Mask leaving marks around his nose. ? ?Physical Exam:  ? ?Appearance - well kempt  ? ?ENMT - no sinus tenderness, no oral exudate, no LAN, Mallampati 4 airway, no stridor, deviated septum, clear nasal drainage ? ?Respiratory - equal breath sounds bilaterally, no wheezing or rales ? ?CV - s1s2 regular rate and rhythm, no murmurs ? ?Ext - no clubbing, no edema ? ?Skin - no rashes ? ?Psych - normal mood and affect ? ?  ?Pulmonary testing:  ?PFT 10/12/19 >> FEV1 3.38 (108%), FEV1% 79, TLC 6.88 (99%), DLCO 102% ? ?Sleep Tests:  ?HST 11/18/14 >> AHI 30, SpO2 low 70% ?Auto CPAP 09/11/21 to 10/10/21 >> used on 30 of 30 nights with average 7 hrs 50 min.  Average AHI 3.6 with median CPAP 11 and 90 th percentile CPAP 14 cm H2O. ? ?Social History:  ?He  reports that he quit smoking about 10 years ago. His smoking use included cigarettes. He has a 5.00 pack-year smoking history. He has never used smokeless tobacco. He reports that he does not drink alcohol and does not  use drugs. ? ?Family History:  ?His family history includes Arthritis in an other family member; Cancer in an other family member; Diabetes in his maternal uncle and another family member; Heart attack in his father; Heart disease in an other family member; Kidney disease in an other family member; Stroke in his mother and another family member; Throat cancer in his father. ?  ? ? ?Assessment/Plan:  ? ?Chronic rhinitis with post nasal drip and deviated nasal septum. ?- continue nasal irrigation, flonase, astepro ?- if this gets worse then he would need ENT assessment ? ?Wheezing. ?- better ?- prn albuterol ? ?Obstructive sleep apnea. ?- he is compliant with CPAP and reports benefit from therapy ?- he uses [facility] for his DME ?- continue auto CPAP 5 to 15 cm H2O; he has Respironics device ? ?Time Spent Involved in Patient Care on Day of Examination:  ?26 minutes ? ?Follow up:  ? ?Patient Instructions  ?Will arrange for refitting of your CPAP mask ? ?Follow up in 6 months ? ?Medication List:  ? ?Allergies as of 12/19/2021   ? ?   Reactions  ? Ketoconazole Other (See Comments)  ? Pain all over body  ? ?  ? ?  ?Medication List  ?  ? ?  ? Accurate as of Dec 19, 2021  3:41 PM. If you have  any questions, ask your nurse or doctor.  ?  ?  ? ?  ? ?albuterol 108 (90 Base) MCG/ACT inhaler ?Commonly known as: VENTOLIN HFA ?Inhale 1-2 puffs into the lungs every 6 (six) hours as needed. ?  ?aspirin EC 81 MG tablet ?Take 1 tablet (81 mg total) by mouth daily. ?What changed:  ?when to take this ?reasons to take this ?  ?Azelastine HCl 0.15 % Soln ?Commonly known as: Astepro ?Place 1 spray into the nose 2 (two) times daily. ?  ?fluticasone 50 MCG/ACT nasal spray ?Commonly known as: FLONASE ?Place 1 spray into both nostrils daily. ?  ?hydrochlorothiazide 25 MG tablet ?Commonly known as: HYDRODIURIL ?Take 1 tablet (25 mg total) by mouth daily. ?  ?levothyroxine 137 MCG tablet ?Commonly known as: SYNTHROID ?Take 1 tablet by mouth in the  morning on an empty stomach ?What changed: Another medication with the same name was removed. Continue taking this medication, and follow the directions you see here. ?Changed by: Chesley Mires, MD ?  ?metFORMIN 500 MG 24 hr tablet ?Commonly known as: GLUCOPHAGE-XR ?Take 1 tablet by mouth daily for the first week then 2 tablets with a meal once a day. ?  ? ?  ? ? ?Signature:  ?Chesley Mires, MD ?[place] ?Pager - [phone] - [phone] ?12/19/2021, 3:41 PM ?  ? ? ? ? ? ? ? ? ?

## 2021-12-19 NOTE — Patient Instructions (Signed)
Will arrange for refitting of your CPAP mask ? ?Follow up in 6 months ?

## 2021-12-20 ENCOUNTER — Other Ambulatory Visit (HOSPITAL_COMMUNITY): Payer: Self-pay

## 2021-12-20 MED ORDER — LEVOTHYROXINE SODIUM 125 MCG PO TABS
ORAL_TABLET | ORAL | 11 refills | Status: DC
  Filled 2021-12-20: qty 30, 30d supply, fill #0
  Filled 2022-01-17: qty 30, 30d supply, fill #1
  Filled 2022-02-14: qty 30, 30d supply, fill #2

## 2022-01-17 ENCOUNTER — Other Ambulatory Visit (HOSPITAL_COMMUNITY): Payer: Self-pay

## 2022-02-14 ENCOUNTER — Other Ambulatory Visit (HOSPITAL_COMMUNITY): Payer: Self-pay

## 2022-03-07 ENCOUNTER — Other Ambulatory Visit (HOSPITAL_COMMUNITY): Payer: Self-pay

## 2022-03-07 MED ORDER — LEVOTHYROXINE SODIUM 112 MCG PO TABS
ORAL_TABLET | ORAL | 11 refills | Status: DC
Start: 2022-03-07 — End: 2022-05-29
  Filled 2022-03-07: qty 30, 30d supply, fill #0

## 2022-03-08 ENCOUNTER — Other Ambulatory Visit (HOSPITAL_COMMUNITY): Payer: Self-pay

## 2022-03-14 ENCOUNTER — Other Ambulatory Visit (HOSPITAL_COMMUNITY): Payer: Self-pay

## 2022-03-16 ENCOUNTER — Other Ambulatory Visit (HOSPITAL_COMMUNITY): Payer: Self-pay

## 2022-03-16 MED ORDER — LEVOTHYROXINE SODIUM 100 MCG PO TABS
ORAL_TABLET | ORAL | 11 refills | Status: DC
  Filled 2022-03-16: qty 34, 30d supply, fill #0
  Filled 2022-04-15: qty 34, 30d supply, fill #1
  Filled 2022-05-22: qty 34, 30d supply, fill #2

## 2022-03-18 ENCOUNTER — Other Ambulatory Visit (HOSPITAL_COMMUNITY): Payer: Self-pay

## 2022-03-28 ENCOUNTER — Other Ambulatory Visit (HOSPITAL_COMMUNITY): Payer: Self-pay

## 2022-03-29 ENCOUNTER — Other Ambulatory Visit (HOSPITAL_COMMUNITY): Payer: Self-pay

## 2022-04-13 ENCOUNTER — Other Ambulatory Visit (HOSPITAL_COMMUNITY): Payer: Self-pay

## 2022-04-15 ENCOUNTER — Other Ambulatory Visit (HOSPITAL_COMMUNITY): Payer: Self-pay

## 2022-04-22 ENCOUNTER — Other Ambulatory Visit (HOSPITAL_COMMUNITY): Payer: Self-pay

## 2022-04-27 ENCOUNTER — Other Ambulatory Visit (HOSPITAL_COMMUNITY): Payer: Self-pay

## 2022-04-27 MED ORDER — LEVOTHYROXINE SODIUM 100 MCG PO TABS
ORAL_TABLET | ORAL | 11 refills | Status: DC
  Filled 2022-04-27: qty 34, 30d supply, fill #0

## 2022-04-28 ENCOUNTER — Other Ambulatory Visit: Payer: Self-pay | Admitting: Family Medicine

## 2022-04-28 ENCOUNTER — Other Ambulatory Visit (HOSPITAL_COMMUNITY): Payer: Self-pay

## 2022-04-28 MED ORDER — HYDROCHLOROTHIAZIDE 25 MG PO TABS
25.0000 mg | ORAL_TABLET | Freq: Every day | ORAL | 0 refills | Status: DC
  Filled 2022-04-28 – 2022-07-27 (×3): qty 90, 90d supply, fill #0

## 2022-04-28 NOTE — Telephone Encounter (Signed)
Pt need appointment for further refills

## 2022-05-22 ENCOUNTER — Other Ambulatory Visit (HOSPITAL_COMMUNITY): Payer: Self-pay

## 2022-05-26 ENCOUNTER — Encounter: Payer: No Typology Code available for payment source | Admitting: Family Medicine

## 2022-05-29 ENCOUNTER — Encounter: Payer: Self-pay | Admitting: Family Medicine

## 2022-05-29 ENCOUNTER — Other Ambulatory Visit (HOSPITAL_COMMUNITY): Payer: Self-pay

## 2022-05-29 ENCOUNTER — Ambulatory Visit (INDEPENDENT_AMBULATORY_CARE_PROVIDER_SITE_OTHER): Payer: No Typology Code available for payment source | Admitting: Family Medicine

## 2022-05-29 VITALS — BP 124/78 | HR 60 | Temp 98.3°F | Ht 69.0 in | Wt 276.0 lb

## 2022-05-29 DIAGNOSIS — Z0000 Encounter for general adult medical examination without abnormal findings: Secondary | ICD-10-CM

## 2022-05-29 DIAGNOSIS — E890 Postprocedural hypothyroidism: Secondary | ICD-10-CM

## 2022-05-29 LAB — CBC WITH DIFFERENTIAL/PLATELET
Basophils Absolute: 0.1 10*3/uL (ref 0.0–0.1)
Basophils Relative: 1.3 % (ref 0.0–3.0)
Eosinophils Absolute: 0.3 10*3/uL (ref 0.0–0.7)
Eosinophils Relative: 5 % (ref 0.0–5.0)
HCT: 42.6 % (ref 39.0–52.0)
Hemoglobin: 13.9 g/dL (ref 13.0–17.0)
Lymphocytes Relative: 32.5 % (ref 12.0–46.0)
Lymphs Abs: 1.7 10*3/uL (ref 0.7–4.0)
MCHC: 32.6 g/dL (ref 30.0–36.0)
MCV: 85.5 fl (ref 78.0–100.0)
Monocytes Absolute: 0.4 10*3/uL (ref 0.1–1.0)
Monocytes Relative: 8 % (ref 3.0–12.0)
Neutro Abs: 2.7 10*3/uL (ref 1.4–7.7)
Neutrophils Relative %: 53.2 % (ref 43.0–77.0)
Platelets: 193 10*3/uL (ref 150.0–400.0)
RBC: 4.99 Mil/uL (ref 4.22–5.81)
RDW: 14.9 % (ref 11.5–15.5)
WBC: 5.2 10*3/uL (ref 4.0–10.5)

## 2022-05-29 LAB — LIPID PANEL
Cholesterol: 205 mg/dL — ABNORMAL HIGH (ref 0–200)
HDL: 45.8 mg/dL (ref >39.00)
LDL Cholesterol: 133 mg/dL — ABNORMAL HIGH (ref 0–99)
NonHDL: 158.97
Total CHOL/HDL Ratio: 4
Triglycerides: 130 mg/dL (ref 0.0–149.0)
VLDL: 26 mg/dL (ref 0.0–40.0)

## 2022-05-29 LAB — HEPATIC FUNCTION PANEL
ALT: 15 U/L (ref 0–53)
AST: 17 U/L (ref 0–37)
Albumin: 4.2 g/dL (ref 3.5–5.2)
Alkaline Phosphatase: 86 U/L (ref 39–117)
Bilirubin, Direct: 0.1 mg/dL (ref 0.0–0.3)
Total Bilirubin: 0.5 mg/dL (ref 0.2–1.2)
Total Protein: 7.6 g/dL (ref 6.0–8.3)

## 2022-05-29 LAB — URINALYSIS
Bilirubin Urine: NEGATIVE
Hgb urine dipstick: NEGATIVE
Ketones, ur: NEGATIVE
Leukocytes,Ua: NEGATIVE
Nitrite: NEGATIVE
Specific Gravity, Urine: 1.025 (ref 1.000–1.030)
Total Protein, Urine: NEGATIVE
Urine Glucose: NEGATIVE
Urobilinogen, UA: 0.2 (ref 0.0–1.0)
pH: 5.5 (ref 5.0–8.0)

## 2022-05-29 LAB — HEMOGLOBIN A1C: Hgb A1c MFr Bld: 6.5 % (ref 4.6–6.5)

## 2022-05-29 LAB — BASIC METABOLIC PANEL
BUN: 18 mg/dL (ref 6–23)
CO2: 28 mEq/L (ref 19–32)
Calcium: 9.3 mg/dL (ref 8.4–10.5)
Chloride: 100 mEq/L (ref 96–112)
Creatinine, Ser: 1.32 mg/dL (ref 0.40–1.50)
GFR: 59.37 mL/min — ABNORMAL LOW (ref >60.00)
Glucose, Bld: 74 mg/dL (ref 70–99)
Potassium: 4.1 mEq/L (ref 3.5–5.1)
Sodium: 137 mEq/L (ref 135–145)

## 2022-05-29 LAB — PSA: PSA: 1.54 ng/mL (ref 0.10–4.00)

## 2022-05-29 LAB — T3, FREE: T3, Free: 3.7 pg/mL (ref 2.3–4.2)

## 2022-05-29 LAB — T4, FREE: Free T4: 0.89 ng/dL (ref 0.60–1.60)

## 2022-05-29 LAB — TSH: TSH: 0.09 u[IU]/mL — ABNORMAL LOW (ref 0.35–5.50)

## 2022-05-29 MED ORDER — TRULANCE 3 MG PO TABS
1.0000 | ORAL_TABLET | Freq: Every day | ORAL | 3 refills | Status: DC
  Filled 2022-05-29: qty 90, 90d supply, fill #0

## 2022-05-29 NOTE — Progress Notes (Signed)
Subjective:    Patient ID: Christopher Howard, male    DOB: [birth year], 56 y.o.   MRN: [id number]  HPI Here for a well exam.  He feels fine except for chronic constipation. He has tried Miralax, bisacodyl, and milk of magnesium with poor results. He asks to try Trulance. He sees Dr. Buddy Duty for hypothyroidism, and they have been working on getting his levothyroxine dose stabilized.    Review of Systems  Constitutional: Negative.   HENT: Negative.    Eyes: Negative.   Respiratory: Negative.    Cardiovascular: Negative.   Gastrointestinal:  Positive for constipation.  Genitourinary: Negative.   Musculoskeletal: Negative.   Skin: Negative.   Neurological: Negative.   Psychiatric/Behavioral: Negative.         Objective:   Physical Exam Constitutional:      General: He is not in acute distress.    Appearance: Normal appearance. He is well-developed. He is obese. He is not diaphoretic.  HENT:     Head: Normocephalic and atraumatic.     Right Ear: External ear normal.     Left Ear: External ear normal.     Nose: Nose normal.     Mouth/Throat:     Pharynx: No oropharyngeal exudate.  Eyes:     General: No scleral icterus.       Right eye: No discharge.        Left eye: No discharge.     Conjunctiva/sclera: Conjunctivae normal.     Pupils: Pupils are equal, round, and reactive to light.  Neck:     Thyroid: No thyromegaly.     Vascular: No JVD.     Trachea: No tracheal deviation.  Cardiovascular:     Rate and Rhythm: Normal rate and regular rhythm.     Heart sounds: Normal heart sounds. No murmur heard.    No friction rub. No gallop.  Pulmonary:     Effort: Pulmonary effort is normal. No respiratory distress.     Breath sounds: Normal breath sounds. No wheezing or rales.  Chest:     Chest wall: No tenderness.  Abdominal:     General: Bowel sounds are normal. There is no distension.     Palpations: Abdomen is soft. There is no mass.     Tenderness: There is no abdominal  tenderness. There is no guarding or rebound.  Genitourinary:    Penis: Normal. No tenderness.      Testes: Normal.     Prostate: Normal.     Rectum: Normal. Guaiac result negative.  Musculoskeletal:        General: No tenderness. Normal range of motion.     Cervical back: Neck supple.  Lymphadenopathy:     Cervical: No cervical adenopathy.  Skin:    General: Skin is warm and dry.     Coloration: Skin is not pale.     Findings: No erythema or rash.  Neurological:     Mental Status: He is alert and oriented to person, place, and time.     Cranial Nerves: No cranial nerve deficit.     Motor: No abnormal muscle tone.     Coordination: Coordination normal.     Deep Tendon Reflexes: Reflexes are normal and symmetric. Reflexes normal.  Psychiatric:        Behavior: Behavior normal.        Thought Content: Thought content normal.        Judgment: Judgment normal.  Assessment & Plan:  Well exam. We discussed diet and exercise. Get fasting labs. He will try Trulance 3 mg daily for the constipation.  Alysia Penna, MD

## 2022-05-30 ENCOUNTER — Other Ambulatory Visit (HOSPITAL_COMMUNITY): Payer: Self-pay

## 2022-05-31 ENCOUNTER — Other Ambulatory Visit (HOSPITAL_COMMUNITY): Payer: Self-pay

## 2022-06-02 ENCOUNTER — Other Ambulatory Visit (HOSPITAL_COMMUNITY): Payer: Self-pay

## 2022-06-05 ENCOUNTER — Encounter: Payer: Self-pay | Admitting: Family Medicine

## 2022-06-05 ENCOUNTER — Telehealth: Payer: Self-pay

## 2022-06-05 NOTE — Telephone Encounter (Signed)
Pt PA for Trulance 3 mg was sent to plan for determination

## 2022-06-06 ENCOUNTER — Other Ambulatory Visit (HOSPITAL_COMMUNITY): Payer: Self-pay

## 2022-06-06 NOTE — Telephone Encounter (Signed)
I have not seen such a form. Have we gotten one?

## 2022-06-13 ENCOUNTER — Other Ambulatory Visit (HOSPITAL_COMMUNITY): Payer: Self-pay

## 2022-06-13 ENCOUNTER — Encounter: Payer: Self-pay | Admitting: Family Medicine

## 2022-06-13 ENCOUNTER — Other Ambulatory Visit: Payer: Self-pay | Admitting: Family Medicine

## 2022-06-13 DIAGNOSIS — K5900 Constipation, unspecified: Secondary | ICD-10-CM

## 2022-06-14 DIAGNOSIS — K5900 Constipation, unspecified: Secondary | ICD-10-CM | POA: Insufficient documentation

## 2022-06-14 NOTE — Telephone Encounter (Signed)
No not ChristopherKerr. Christopher Howard needs to see a GI provider. I see Christopher Howard with [facility] did his colonoscopy in 2015, so I will refer him to see Christopher Howard for this problem

## 2022-06-14 NOTE — Telephone Encounter (Signed)
The only other one I know like this is Linzess, but it looks like this is not covered either. He should ask his GI for advice

## 2022-06-14 NOTE — Telephone Encounter (Signed)
Please ask him who originally prescribed this medication for him (GI ?)

## 2022-06-15 ENCOUNTER — Encounter: Payer: Self-pay | Admitting: Gastroenterology

## 2022-06-16 ENCOUNTER — Other Ambulatory Visit (HOSPITAL_COMMUNITY): Payer: Self-pay

## 2022-07-03 ENCOUNTER — Other Ambulatory Visit (HOSPITAL_COMMUNITY): Payer: Self-pay

## 2022-07-03 MED ORDER — LEVOTHYROXINE SODIUM 100 MCG PO TABS
100.0000 ug | ORAL_TABLET | ORAL | 11 refills | Status: DC
  Filled 2022-07-03: qty 30, 30d supply, fill #0

## 2022-07-07 ENCOUNTER — Other Ambulatory Visit (HOSPITAL_COMMUNITY): Payer: Self-pay

## 2022-07-07 MED ORDER — LEVOTHYROXINE SODIUM 88 MCG PO TABS
88.0000 ug | ORAL_TABLET | Freq: Every morning | ORAL | 11 refills | Status: DC
  Filled 2022-07-07: qty 30, 30d supply, fill #0
  Filled 2022-08-04 (×2): qty 30, 30d supply, fill #1
  Filled 2022-09-13: qty 30, 30d supply, fill #2
  Filled 2022-10-07: qty 30, 30d supply, fill #3
  Filled 2022-11-07: qty 30, 30d supply, fill #4
  Filled 2022-12-11: qty 30, 30d supply, fill #5
  Filled 2023-01-09: qty 30, 30d supply, fill #6

## 2022-07-08 ENCOUNTER — Other Ambulatory Visit (HOSPITAL_COMMUNITY): Payer: Self-pay

## 2022-07-22 ENCOUNTER — Other Ambulatory Visit (HOSPITAL_COMMUNITY): Payer: Self-pay

## 2022-07-24 ENCOUNTER — Other Ambulatory Visit (HOSPITAL_COMMUNITY): Payer: Self-pay

## 2022-07-24 MED ORDER — METFORMIN HCL ER 500 MG PO TB24
2000.0000 mg | ORAL_TABLET | Freq: Every day | ORAL | 4 refills | Status: DC
  Filled 2022-07-24 – 2022-09-13 (×4): qty 360, 90d supply, fill #0
  Filled 2022-11-07 – 2022-12-11 (×2): qty 360, 90d supply, fill #1
  Filled 2023-03-10: qty 360, 90d supply, fill #2
  Filled 2023-06-14: qty 360, 90d supply, fill #3

## 2022-07-27 ENCOUNTER — Other Ambulatory Visit (HOSPITAL_COMMUNITY): Payer: Self-pay

## 2022-08-04 ENCOUNTER — Other Ambulatory Visit (HOSPITAL_COMMUNITY): Payer: Self-pay

## 2022-08-04 ENCOUNTER — Other Ambulatory Visit: Payer: Self-pay

## 2022-08-05 ENCOUNTER — Other Ambulatory Visit (HOSPITAL_COMMUNITY): Payer: Self-pay

## 2022-08-10 ENCOUNTER — Ambulatory Visit: Payer: No Typology Code available for payment source | Admitting: Gastroenterology

## 2022-09-01 DIAGNOSIS — E890 Postprocedural hypothyroidism: Secondary | ICD-10-CM | POA: Diagnosis not present

## 2022-09-13 ENCOUNTER — Other Ambulatory Visit (HOSPITAL_COMMUNITY): Payer: Self-pay

## 2022-09-27 DIAGNOSIS — G4733 Obstructive sleep apnea (adult) (pediatric): Secondary | ICD-10-CM | POA: Diagnosis not present

## 2022-10-21 ENCOUNTER — Other Ambulatory Visit: Payer: Self-pay | Admitting: Family Medicine

## 2022-10-23 ENCOUNTER — Other Ambulatory Visit (HOSPITAL_COMMUNITY): Payer: Self-pay

## 2022-10-23 MED ORDER — HYDROCHLOROTHIAZIDE 25 MG PO TABS
25.0000 mg | ORAL_TABLET | Freq: Every day | ORAL | 0 refills | Status: DC
  Filled 2022-10-23: qty 90, 90d supply, fill #0

## 2022-10-26 DIAGNOSIS — G4733 Obstructive sleep apnea (adult) (pediatric): Secondary | ICD-10-CM | POA: Diagnosis not present

## 2022-11-07 ENCOUNTER — Other Ambulatory Visit: Payer: Self-pay

## 2022-11-07 ENCOUNTER — Other Ambulatory Visit (HOSPITAL_COMMUNITY): Payer: Self-pay

## 2022-11-20 ENCOUNTER — Encounter: Payer: Self-pay | Admitting: Family Medicine

## 2022-11-20 DIAGNOSIS — R739 Hyperglycemia, unspecified: Secondary | ICD-10-CM

## 2022-11-21 NOTE — Telephone Encounter (Signed)
Yes that would be a good idea. Insurance allows [place] to check an A1c as often as every 90 days. Have him schedule a lab appt and I will put in the order.

## 2022-11-22 ENCOUNTER — Other Ambulatory Visit (INDEPENDENT_AMBULATORY_CARE_PROVIDER_SITE_OTHER): Payer: 59

## 2022-11-22 DIAGNOSIS — R739 Hyperglycemia, unspecified: Secondary | ICD-10-CM

## 2022-11-22 LAB — HEMOGLOBIN A1C: Hgb A1c MFr Bld: 6.3 % (ref 4.6–6.5)

## 2022-11-22 NOTE — Telephone Encounter (Signed)
Patient scheduled lab appt for 11/22/22 for A1c labs

## 2022-11-26 DIAGNOSIS — G4733 Obstructive sleep apnea (adult) (pediatric): Secondary | ICD-10-CM | POA: Diagnosis not present

## 2023-01-05 ENCOUNTER — Encounter: Payer: Self-pay | Admitting: Family Medicine

## 2023-01-08 ENCOUNTER — Other Ambulatory Visit: Payer: Self-pay

## 2023-01-08 NOTE — Telephone Encounter (Signed)
Spoke with Christopher Howard advised that he needs to schedule an office visit for Dr Clent Ridges to see him for this problem, Christopher Howard stated that he will call to schedule

## 2023-01-11 DIAGNOSIS — G4733 Obstructive sleep apnea (adult) (pediatric): Secondary | ICD-10-CM | POA: Diagnosis not present

## 2023-01-22 ENCOUNTER — Other Ambulatory Visit: Payer: Self-pay | Admitting: Family Medicine

## 2023-01-22 ENCOUNTER — Other Ambulatory Visit (HOSPITAL_COMMUNITY): Payer: Self-pay

## 2023-01-22 ENCOUNTER — Other Ambulatory Visit: Payer: Self-pay

## 2023-01-22 ENCOUNTER — Encounter: Payer: Self-pay | Admitting: Family Medicine

## 2023-01-22 ENCOUNTER — Ambulatory Visit (INDEPENDENT_AMBULATORY_CARE_PROVIDER_SITE_OTHER): Payer: 59 | Admitting: Family Medicine

## 2023-01-22 VITALS — BP 124/80 | HR 61 | Temp 98.6°F | Wt 282.0 lb

## 2023-01-22 DIAGNOSIS — E890 Postprocedural hypothyroidism: Secondary | ICD-10-CM | POA: Diagnosis not present

## 2023-01-22 DIAGNOSIS — E119 Type 2 diabetes mellitus without complications: Secondary | ICD-10-CM | POA: Diagnosis not present

## 2023-01-22 DIAGNOSIS — Z7984 Long term (current) use of oral hypoglycemic drugs: Secondary | ICD-10-CM | POA: Diagnosis not present

## 2023-01-22 MED ORDER — LEVOTHYROXINE SODIUM 100 MCG PO TABS: 100.0000 ug | ORAL_TABLET | ORAL | 11 refills | Status: DC

## 2023-01-22 MED ORDER — HYDROCHLOROTHIAZIDE 25 MG PO TABS
25.0000 mg | ORAL_TABLET | Freq: Every day | ORAL | 0 refills | Status: DC
  Filled 2023-01-22: qty 90, 90d supply, fill #0

## 2023-01-22 NOTE — Progress Notes (Signed)
   Subjective:    Patient ID: Christopher Howard, male    DOB: [birth year], 57 y.o.   MRN: [id number]  HPI Here to discuss fatigue and SOB. He has had this for several years, and we have determined the main etiology for this is his obesity. He has been working with a weight loss clinic and he has been following a strict diet, but he has been unable to lose any weight. He has been seeing Dr. Sharl Ma for his hypothyroidism, and Dr. Sharl Ma has been steadily reducing the dose of his Levothyroxine. He had been taking 125 mcg daily, but his TSH last fall was < 0.1. At that point the dose of Levothyroxine was lowered to 100 mcg. On 07-05-22 his A1c was up to 0.17, but the Levothyroxine dose was lowered again to 88 mcg daily. He had felt better on the higher doses, and now he is feeling worse again. On 09-01-22 the TSH was up to 3.42, and he was maintained on that dose. He is also interested in trying one of the new weight loss shot medications on the market.    Review of Systems  Constitutional:  Positive for fatigue.  Respiratory:  Positive for shortness of breath. Negative for cough and wheezing.   Cardiovascular: Negative.   Gastrointestinal: Negative.   Genitourinary: Negative.        Objective:   Physical Exam Constitutional:      Appearance: He is obese.  Cardiovascular:     Rate and Rhythm: Normal rate and regular rhythm.     Pulses: Normal pulses.     Heart sounds: Normal heart sounds.  Pulmonary:     Effort: Pulmonary effort is normal.     Breath sounds: Normal breath sounds.  Neurological:     Mental Status: He is alert.           Assessment & Plan:  For the hypothyroidism, he seemed to do better on the 100 mcg dose, so we will increase the dose to this. We will check a TSH, free T3, and free T4 today. As for the obesity, he qualifies as having type 2 diabetes since his A1c was 6.5% a few months ago. I think he would be an excellent candidate for one of the GLP-1 agonists, so I asked  him to check with his insurance company as to which one of these medications they will cover. Once he relays this to [place], we will plan on starting him on it.  Gershon Crane, MD

## 2023-01-23 ENCOUNTER — Other Ambulatory Visit: Payer: Self-pay

## 2023-01-23 LAB — T4, FREE: Free T4: 0.69 ng/dL (ref 0.60–1.60)

## 2023-01-23 LAB — TSH: TSH: 1.76 u[IU]/mL (ref 0.35–5.50)

## 2023-01-23 LAB — T3, FREE: T3, Free: 2.8 pg/mL (ref 2.3–4.2)

## 2023-01-24 ENCOUNTER — Other Ambulatory Visit: Payer: Self-pay

## 2023-01-24 ENCOUNTER — Encounter: Payer: Self-pay | Admitting: Family Medicine

## 2023-01-24 NOTE — Addendum Note (Signed)
Addended by: Johnella Moloney on: 01/24/2023 04:17 PM   Modules accepted: Orders

## 2023-01-24 NOTE — Telephone Encounter (Signed)
Please call them for a PA for whatever GLP-1 medication they will cover. He is obese and he has type 2 diabetes and HTN

## 2023-01-25 NOTE — Telephone Encounter (Signed)
PA sent to pt plan awaiting for determination

## 2023-01-26 NOTE — Telephone Encounter (Signed)
Pt notified via My Chart

## 2023-01-26 NOTE — Telephone Encounter (Signed)
Prior Authorization for GLP-1 medication was sent to [employer]. Pt  will be notified of their determination.

## 2023-01-30 ENCOUNTER — Telehealth: Payer: Self-pay

## 2023-01-30 ENCOUNTER — Other Ambulatory Visit (HOSPITAL_COMMUNITY): Payer: Self-pay

## 2023-01-30 NOTE — Telephone Encounter (Signed)
Approval letter in charts

## 2023-01-30 NOTE — Telephone Encounter (Signed)
Patient Advocate Encounter  Prior Authorization for Christopher Howard has been approved with [employer].    PA# [id number] Effective dates: 01/26/23 through 01/25/24  Per [employer] test claim, copay for 28 days supply is $25 (with eVoucher)

## 2023-01-31 ENCOUNTER — Encounter: Payer: Self-pay | Admitting: Family Medicine

## 2023-01-31 ENCOUNTER — Other Ambulatory Visit (HOSPITAL_COMMUNITY): Payer: Self-pay

## 2023-01-31 MED ORDER — TIRZEPATIDE 2.5 MG/0.5ML ~~LOC~~ SOAJ
2.5000 mg | SUBCUTANEOUS | 0 refills | Status: DC
  Filled 2023-01-31: qty 2, 28d supply, fill #0

## 2023-01-31 NOTE — Telephone Encounter (Signed)
Pt needs a new Rx for [facility] sent to his pharmacy

## 2023-01-31 NOTE — Telephone Encounter (Signed)
Done

## 2023-02-08 ENCOUNTER — Encounter: Payer: Self-pay | Admitting: Family Medicine

## 2023-02-09 ENCOUNTER — Other Ambulatory Visit (HOSPITAL_COMMUNITY): Payer: Self-pay

## 2023-02-09 MED ORDER — TIRZEPATIDE 5 MG/0.5ML ~~LOC~~ SOAJ
5.0000 mg | SUBCUTANEOUS | 2 refills | Status: DC
  Filled 2023-02-09: qty 6, 84d supply, fill #0
  Filled 2023-02-23: qty 2, 28d supply, fill #0
  Filled 2023-03-23: qty 2, 28d supply, fill #1

## 2023-02-09 NOTE — Telephone Encounter (Signed)
I sent in to go up to 5 mg weekly

## 2023-02-11 DIAGNOSIS — G4733 Obstructive sleep apnea (adult) (pediatric): Secondary | ICD-10-CM | POA: Diagnosis not present

## 2023-02-12 NOTE — Telephone Encounter (Signed)
Pt is aware.  

## 2023-02-23 ENCOUNTER — Other Ambulatory Visit (HOSPITAL_COMMUNITY): Payer: Self-pay

## 2023-02-23 ENCOUNTER — Encounter: Payer: Self-pay | Admitting: Family Medicine

## 2023-02-27 ENCOUNTER — Other Ambulatory Visit (HOSPITAL_COMMUNITY): Payer: Self-pay

## 2023-02-27 ENCOUNTER — Other Ambulatory Visit: Payer: Self-pay

## 2023-02-27 MED ORDER — LEVOTHYROXINE SODIUM 100 MCG PO TABS
100.0000 ug | ORAL_TABLET | ORAL | 5 refills | Status: DC
  Filled 2023-02-27: qty 30, 30d supply, fill #0
  Filled 2023-03-27: qty 30, 30d supply, fill #1
  Filled 2023-05-02: qty 30, 30d supply, fill #2
  Filled 2023-06-07: qty 30, 30d supply, fill #3
  Filled 2023-07-11: qty 30, 30d supply, fill #4
  Filled 2023-08-05: qty 30, 30d supply, fill #5

## 2023-02-28 ENCOUNTER — Other Ambulatory Visit: Payer: Self-pay | Admitting: Oncology

## 2023-02-28 DIAGNOSIS — Z006 Encounter for examination for normal comparison and control in clinical research program: Secondary | ICD-10-CM

## 2023-03-13 DIAGNOSIS — G4733 Obstructive sleep apnea (adult) (pediatric): Secondary | ICD-10-CM | POA: Diagnosis not present

## 2023-03-15 ENCOUNTER — Encounter: Payer: Self-pay | Admitting: Family Medicine

## 2023-03-19 NOTE — Telephone Encounter (Signed)
I suggest he go up to the next dose which is 7.5 mg weekly

## 2023-03-26 ENCOUNTER — Other Ambulatory Visit (HOSPITAL_COMMUNITY): Payer: Self-pay

## 2023-03-26 MED ORDER — TIRZEPATIDE 7.5 MG/0.5ML ~~LOC~~ SOAJ
7.5000 mg | SUBCUTANEOUS | 5 refills | Status: DC
  Filled 2023-03-26: qty 6, 84d supply, fill #0
  Filled 2023-03-27: qty 2, 28d supply, fill #0
  Filled 2023-04-19: qty 2, 28d supply, fill #1

## 2023-03-26 NOTE — Telephone Encounter (Signed)
I sent in the 7.5  mg dose

## 2023-03-27 ENCOUNTER — Other Ambulatory Visit (HOSPITAL_COMMUNITY): Payer: Self-pay

## 2023-03-27 ENCOUNTER — Other Ambulatory Visit: Payer: Self-pay

## 2023-03-28 ENCOUNTER — Other Ambulatory Visit (HOSPITAL_COMMUNITY): Payer: Self-pay

## 2023-04-06 ENCOUNTER — Other Ambulatory Visit (HOSPITAL_COMMUNITY): Payer: Self-pay

## 2023-04-19 ENCOUNTER — Other Ambulatory Visit: Payer: Self-pay

## 2023-04-19 ENCOUNTER — Other Ambulatory Visit: Payer: Self-pay | Admitting: Family Medicine

## 2023-04-19 DIAGNOSIS — G4733 Obstructive sleep apnea (adult) (pediatric): Secondary | ICD-10-CM | POA: Diagnosis not present

## 2023-04-20 ENCOUNTER — Other Ambulatory Visit (HOSPITAL_COMMUNITY): Payer: Self-pay

## 2023-04-20 MED ORDER — HYDROCHLOROTHIAZIDE 25 MG PO TABS
25.0000 mg | ORAL_TABLET | Freq: Every day | ORAL | 0 refills | Status: DC
  Filled 2023-04-20: qty 90, 90d supply, fill #0

## 2023-04-25 ENCOUNTER — Other Ambulatory Visit (HOSPITAL_COMMUNITY): Payer: Self-pay

## 2023-05-01 ENCOUNTER — Encounter: Payer: Self-pay | Admitting: Family Medicine

## 2023-05-02 ENCOUNTER — Other Ambulatory Visit (HOSPITAL_COMMUNITY): Payer: Self-pay

## 2023-05-02 MED ORDER — TIRZEPATIDE 10 MG/0.5ML ~~LOC~~ SOAJ
10.0000 mg | SUBCUTANEOUS | 5 refills | Status: DC
  Filled 2023-05-02 (×2): qty 6, 84d supply, fill #0
  Filled 2023-05-18: qty 2, 28d supply, fill #0
  Filled 2023-06-14: qty 2, 28d supply, fill #1
  Filled 2023-07-13: qty 2, 28d supply, fill #2
  Filled 2023-08-14: qty 2, 28d supply, fill #3
  Filled 2023-09-01 – 2023-09-08 (×2): qty 2, 28d supply, fill #4

## 2023-05-02 NOTE — Telephone Encounter (Signed)
Yes I have increased the dose to 10 mg weekly, and I sent in the RX

## 2023-05-03 ENCOUNTER — Other Ambulatory Visit: Payer: Self-pay

## 2023-05-07 ENCOUNTER — Other Ambulatory Visit (HOSPITAL_COMMUNITY): Payer: Self-pay

## 2023-05-11 ENCOUNTER — Other Ambulatory Visit (HOSPITAL_COMMUNITY): Payer: Self-pay

## 2023-05-18 ENCOUNTER — Other Ambulatory Visit: Payer: Self-pay

## 2023-05-20 DIAGNOSIS — G4733 Obstructive sleep apnea (adult) (pediatric): Secondary | ICD-10-CM | POA: Diagnosis not present

## 2023-05-22 ENCOUNTER — Other Ambulatory Visit (HOSPITAL_COMMUNITY): Payer: Self-pay

## 2023-05-28 ENCOUNTER — Encounter: Payer: Self-pay | Admitting: Family Medicine

## 2023-06-08 ENCOUNTER — Other Ambulatory Visit: Payer: 59

## 2023-06-08 ENCOUNTER — Other Ambulatory Visit (HOSPITAL_COMMUNITY): Payer: Self-pay

## 2023-06-08 DIAGNOSIS — E890 Postprocedural hypothyroidism: Secondary | ICD-10-CM

## 2023-06-08 LAB — TSH: TSH: 0.79 u[IU]/mL (ref 0.35–5.50)

## 2023-06-08 LAB — T3, FREE: T3, Free: 3 pg/mL (ref 2.3–4.2)

## 2023-06-08 LAB — T4, FREE: Free T4: 0.82 ng/dL (ref 0.60–1.60)

## 2023-06-14 ENCOUNTER — Other Ambulatory Visit: Payer: Self-pay

## 2023-06-14 ENCOUNTER — Other Ambulatory Visit (HOSPITAL_COMMUNITY): Payer: Self-pay

## 2023-06-15 ENCOUNTER — Other Ambulatory Visit (HOSPITAL_COMMUNITY): Payer: Self-pay

## 2023-06-19 DIAGNOSIS — G4733 Obstructive sleep apnea (adult) (pediatric): Secondary | ICD-10-CM | POA: Diagnosis not present

## 2023-06-29 ENCOUNTER — Other Ambulatory Visit (HOSPITAL_COMMUNITY)
Admission: RE | Admit: 2023-06-29 | Discharge: 2023-06-29 | Disposition: A | Discharge status: Home / Self Care | Payer: 59 | Source: Ambulatory Visit | Attending: Oncology | Admitting: Oncology

## 2023-06-29 DIAGNOSIS — Z006 Encounter for examination for normal comparison and control in clinical research program: Secondary | ICD-10-CM | POA: Insufficient documentation

## 2023-07-11 LAB — GENECONNECT MOLECULAR SCREEN

## 2023-07-11 LAB — HELIX MOLECULAR SCREEN: Genetic Analysis Overall Interpretation: NEGATIVE

## 2023-08-05 ENCOUNTER — Other Ambulatory Visit: Payer: Self-pay | Admitting: Family Medicine

## 2023-08-06 ENCOUNTER — Other Ambulatory Visit: Payer: Self-pay

## 2023-08-07 ENCOUNTER — Other Ambulatory Visit (HOSPITAL_COMMUNITY): Payer: Self-pay

## 2023-08-07 MED ORDER — HYDROCHLOROTHIAZIDE 25 MG PO TABS
25.0000 mg | ORAL_TABLET | Freq: Every day | ORAL | 0 refills | Status: DC
  Filled 2023-08-07: qty 90, 90d supply, fill #0

## 2023-08-10 ENCOUNTER — Other Ambulatory Visit (HOSPITAL_COMMUNITY): Payer: Self-pay

## 2023-08-16 ENCOUNTER — Encounter: Payer: 59 | Admitting: Family Medicine

## 2023-08-16 ENCOUNTER — Other Ambulatory Visit (HOSPITAL_COMMUNITY): Payer: Self-pay

## 2023-08-21 ENCOUNTER — Ambulatory Visit (INDEPENDENT_AMBULATORY_CARE_PROVIDER_SITE_OTHER): Payer: 59 | Admitting: Family Medicine

## 2023-08-21 ENCOUNTER — Encounter: Payer: Self-pay | Admitting: Family Medicine

## 2023-08-21 ENCOUNTER — Other Ambulatory Visit (HOSPITAL_COMMUNITY): Payer: Self-pay

## 2023-08-21 VITALS — BP 110/64 | HR 68 | Temp 98.4°F | Ht 69.0 in | Wt 256.0 lb

## 2023-08-21 DIAGNOSIS — Z0000 Encounter for general adult medical examination without abnormal findings: Secondary | ICD-10-CM | POA: Diagnosis not present

## 2023-08-21 DIAGNOSIS — E890 Postprocedural hypothyroidism: Secondary | ICD-10-CM

## 2023-08-21 DIAGNOSIS — E538 Deficiency of other specified B group vitamins: Secondary | ICD-10-CM | POA: Diagnosis not present

## 2023-08-21 DIAGNOSIS — Z7984 Long term (current) use of oral hypoglycemic drugs: Secondary | ICD-10-CM

## 2023-08-21 DIAGNOSIS — Z7985 Long-term (current) use of injectable non-insulin antidiabetic drugs: Secondary | ICD-10-CM

## 2023-08-21 DIAGNOSIS — Z23 Encounter for immunization: Secondary | ICD-10-CM

## 2023-08-21 DIAGNOSIS — E559 Vitamin D deficiency, unspecified: Secondary | ICD-10-CM | POA: Diagnosis not present

## 2023-08-21 DIAGNOSIS — E119 Type 2 diabetes mellitus without complications: Secondary | ICD-10-CM | POA: Diagnosis not present

## 2023-08-21 LAB — BASIC METABOLIC PANEL
BUN: 15 mg/dL (ref 6–23)
CO2: 28 meq/L (ref 19–32)
Calcium: 9.3 mg/dL (ref 8.4–10.5)
Chloride: 101 meq/L (ref 96–112)
Creatinine, Ser: 1.43 mg/dL (ref 0.40–1.50)
GFR: 53.47 mL/min — ABNORMAL LOW (ref >60.00)
Glucose, Bld: 83 mg/dL (ref 70–99)
Potassium: 3.8 meq/L (ref 3.5–5.1)
Sodium: 138 meq/L (ref 135–145)

## 2023-08-21 LAB — CBC WITH DIFFERENTIAL/PLATELET
Basophils Absolute: 0.1 10*3/uL (ref 0.0–0.1)
Basophils Relative: 1.2 % (ref 0.0–3.0)
Eosinophils Absolute: 0.2 10*3/uL (ref 0.0–0.7)
Eosinophils Relative: 5.3 % — ABNORMAL HIGH (ref 0.0–5.0)
HCT: 42.4 % (ref 39.0–52.0)
Hemoglobin: 13.8 g/dL (ref 13.0–17.0)
Lymphocytes Relative: 27.4 % (ref 12.0–46.0)
Lymphs Abs: 1.3 10*3/uL (ref 0.7–4.0)
MCHC: 32.6 g/dL (ref 30.0–36.0)
MCV: 87.7 fL (ref 78.0–100.0)
Monocytes Absolute: 0.4 10*3/uL (ref 0.1–1.0)
Monocytes Relative: 9 % (ref 3.0–12.0)
Neutro Abs: 2.6 10*3/uL (ref 1.4–7.7)
Neutrophils Relative %: 57.1 % (ref 43.0–77.0)
Platelets: 197 10*3/uL (ref 150.0–400.0)
RBC: 4.83 Mil/uL (ref 4.22–5.81)
RDW: 14.6 % (ref 11.5–15.5)
WBC: 4.6 10*3/uL (ref 4.0–10.5)

## 2023-08-21 LAB — HEPATIC FUNCTION PANEL
ALT: 13 U/L (ref 0–53)
AST: 13 U/L (ref 0–37)
Albumin: 4.2 g/dL (ref 3.5–5.2)
Alkaline Phosphatase: 70 U/L (ref 39–117)
Bilirubin, Direct: 0.1 mg/dL (ref 0.0–0.3)
Total Bilirubin: 0.5 mg/dL (ref 0.2–1.2)
Total Protein: 7.2 g/dL (ref 6.0–8.3)

## 2023-08-21 LAB — VITAMIN D 25 HYDROXY (VIT D DEFICIENCY, FRACTURES): VITD: 31.35 ng/mL (ref 30.00–100.00)

## 2023-08-21 LAB — LIPID PANEL
Cholesterol: 223 mg/dL — ABNORMAL HIGH (ref 0–200)
HDL: 54.5 mg/dL (ref >39.00)
LDL Cholesterol: 144 mg/dL — ABNORMAL HIGH (ref 0–99)
NonHDL: 168.07
Total CHOL/HDL Ratio: 4
Triglycerides: 119 mg/dL (ref 0.0–149.0)
VLDL: 23.8 mg/dL (ref 0.0–40.0)

## 2023-08-21 LAB — T3, FREE: T3, Free: 2.7 pg/mL (ref 2.3–4.2)

## 2023-08-21 LAB — PSA: PSA: 1.87 ng/mL (ref 0.10–4.00)

## 2023-08-21 LAB — HEMOGLOBIN A1C: Hgb A1c MFr Bld: 6 % (ref 4.6–6.5)

## 2023-08-21 LAB — T4, FREE: Free T4: 0.63 ng/dL (ref 0.60–1.60)

## 2023-08-21 LAB — TSH: TSH: 4.15 u[IU]/mL (ref 0.35–5.50)

## 2023-08-21 LAB — VITAMIN B12: Vitamin B-12: 191 pg/mL — ABNORMAL LOW (ref 211–911)

## 2023-08-21 MED ORDER — LEVOTHYROXINE SODIUM 100 MCG PO TABS
100.0000 ug | ORAL_TABLET | ORAL | 3 refills | Status: DC
  Filled 2023-08-21 – 2023-09-08 (×2): qty 90, 90d supply, fill #0
  Filled 2023-12-24: qty 90, 90d supply, fill #1
  Filled 2024-03-21: qty 90, 90d supply, fill #2
  Filled 2024-04-29 – 2024-06-20 (×2): qty 90, 90d supply, fill #0

## 2023-08-21 NOTE — Progress Notes (Signed)
 Subjective:    Patient ID: Christopher Howard, male    DOB: September 25, 1965, 57 y.o.   MRN: [id number]  HPI Here for a well exam. He feels well. He has lost 26 lbs since June. He is using Mounjaro , and he wants to stop taking metformin  if possible.    Review of Systems  Constitutional: Negative.   HENT: Negative.    Eyes: Negative.   Respiratory: Negative.    Cardiovascular: Negative.   Gastrointestinal: Negative.   Genitourinary: Negative.   Musculoskeletal: Negative.   Skin: Negative.   Neurological: Negative.   Psychiatric/Behavioral: Negative.         Objective:   Physical Exam Constitutional:      General: He is not in acute distress.    Appearance: Normal appearance. He is well-developed. He is not diaphoretic.  HENT:     Head: Normocephalic and atraumatic.     Right Ear: External ear normal.     Left Ear: External ear normal.     Nose: Nose normal.     Mouth/Throat:     Pharynx: No oropharyngeal exudate.  Eyes:     General: No scleral icterus.       Right eye: No discharge.        Left eye: No discharge.     Conjunctiva/sclera: Conjunctivae normal.     Pupils: Pupils are equal, round, and reactive to light.  Neck:     Thyroid : No thyromegaly.     Vascular: No JVD.     Trachea: No tracheal deviation.  Cardiovascular:     Rate and Rhythm: Normal rate and regular rhythm.     Pulses: Normal pulses.     Heart sounds: Normal heart sounds. No murmur heard.    No friction rub. No gallop.  Pulmonary:     Effort: Pulmonary effort is normal. No respiratory distress.     Breath sounds: Normal breath sounds. No wheezing or rales.  Chest:     Chest wall: No tenderness.  Abdominal:     General: Bowel sounds are normal. There is no distension.     Palpations: Abdomen is soft. There is no mass.     Tenderness: There is no abdominal tenderness. There is no guarding or rebound.  Genitourinary:    Penis: Normal. No tenderness.      Testes: Normal.     Prostate: Normal.      Rectum: Normal. Guaiac result negative.  Musculoskeletal:        General: No tenderness. Normal range of motion.     Cervical back: Neck supple.  Lymphadenopathy:     Cervical: No cervical adenopathy.  Skin:    General: Skin is warm and dry.     Coloration: Skin is not pale.     Findings: No erythema or rash.  Neurological:     General: No focal deficit present.     Mental Status: He is alert and oriented to person, place, and time.     Cranial Nerves: No cranial nerve deficit.     Motor: No abnormal muscle tone.     Coordination: Coordination normal.     Deep Tendon Reflexes: Reflexes are normal and symmetric. Reflexes normal.  Psychiatric:        Mood and Affect: Mood normal.        Behavior: Behavior normal.        Thought Content: Thought content normal.        Judgment: Judgment normal.  Assessment & Plan:  Well exam. We discussed diet and exercise. Get fasting labs. I reminded him to get an eye exam soon.  Garnette Olmsted, MD

## 2023-08-21 NOTE — Addendum Note (Signed)
 Addended by: Carola Rhine on: 08/21/2023 11:13 AM   Modules accepted: Orders

## 2023-08-29 ENCOUNTER — Encounter: Payer: Self-pay | Admitting: Family Medicine

## 2023-08-29 DIAGNOSIS — E890 Postprocedural hypothyroidism: Secondary | ICD-10-CM

## 2023-08-31 NOTE — Telephone Encounter (Signed)
 Done

## 2023-09-01 ENCOUNTER — Other Ambulatory Visit (HOSPITAL_COMMUNITY): Payer: Self-pay

## 2023-09-03 NOTE — Telephone Encounter (Signed)
 Looks like cma, Inocente spoke to pt. And pt's concerns was routed to provider. Please see lab result note.  Provider's response: These results are not concerning, but let's recheck them in 90 days. I will put in the orders   Follow up with pt. Relay an update message from provider.   Pt states he works at [employer] and he will have his blood work done in March. No further action is needed.

## 2023-09-06 ENCOUNTER — Encounter: Payer: Self-pay | Admitting: Family Medicine

## 2023-09-07 NOTE — Telephone Encounter (Signed)
At our last visit we agreed that he could stop the Metformin

## 2023-09-08 ENCOUNTER — Other Ambulatory Visit (HOSPITAL_COMMUNITY): Payer: Self-pay

## 2023-09-13 ENCOUNTER — Other Ambulatory Visit (HOSPITAL_COMMUNITY): Payer: Self-pay

## 2023-09-14 ENCOUNTER — Encounter: Payer: Self-pay | Admitting: Adult Health

## 2023-09-14 ENCOUNTER — Telehealth (INDEPENDENT_AMBULATORY_CARE_PROVIDER_SITE_OTHER): Payer: Commercial Managed Care - PPO | Admitting: Adult Health

## 2023-09-14 DIAGNOSIS — J4520 Mild intermittent asthma, uncomplicated: Secondary | ICD-10-CM | POA: Diagnosis not present

## 2023-09-14 DIAGNOSIS — G4733 Obstructive sleep apnea (adult) (pediatric): Secondary | ICD-10-CM

## 2023-09-14 DIAGNOSIS — J309 Allergic rhinitis, unspecified: Secondary | ICD-10-CM | POA: Diagnosis not present

## 2023-09-14 NOTE — Progress Notes (Signed)
Virtual Visit via Video Note  I connected with Christopher Howard on 09/14/23 at  2:30 PM EST by a video enabled telemedicine application and verified that I am speaking with the correct person using two identifiers.  Location: Patient: Home  Provider: Office    I discussed the limitations of evaluation and management by telemedicine and the availability of in person appointments. The patient expressed understanding and agreed to proceed.  History of Present Illness: 58 year old male followed for obstructive sleep apnea and asthma Today's virtual visit is a follow-up.  Last seen May 2023.  Patient is followed for obstructive sleep apnea.  Patient remains on CPAP at bedtime.  Says he wears CPAP every night feels that he benefits from CPAP.  Typically gets in about 8 hours.  CPAP download shows excellent compliance with daily average usage at 8.5 hours.  AHI 3.2/hour.  Patient uses [facility] as his DME. Uses a fullface mask.    Has been working on weight loss. Is down 30lbs. Exercises regularly . Is taking Mounjaro for DM .   Patient has mild intermittent asthma and allergic rhinitis.  Remains on Astepro daily as needed.  Uses albuterol on occasion.  Denies any flare of cough or wheezing    Observations/Objective: PFT 10/12/19 >> FEV1 3.38 (108%), FEV1% 79, TLC 6.88 (99%), DLCO 102%   Sleep Tests:  HST 11/18/14 >> AHI 30, SpO2 low 70%  Chest xray 08/2021-clear  09/14/2023 Appears well in NAD   Assessment and Plan: Obstructive sleep apnea-excellent control compliance on nocturnal CPAP.  Patient education on CPAP care  Mild intermittent asthma.  Albuterol as needed.  Continue on trigger prevention  Chronic allergic rhinitis.  May use Astepro daily as needed  Plan  Patient Instructions  Albuterol inhaler 1-2 puffs every 6hrs as needed  Astepro nasal spray As needed    Continue on CPAP At bedtime   Work on healthy weight  Do not drive if sleepy   Follow up in 1 year and As needed          Follow Up Instructions:    I discussed the assessment and treatment plan with the patient. The patient was provided an opportunity to ask questions and all were answered. The patient agreed with the plan and demonstrated an understanding of the instructions.   The patient was advised to call back or seek an in-person evaluation if the symptoms worsen or if the condition fails to improve as anticipated.  I provided 20  minutes of non-face-to-face time during this encounter.   Rubye Oaks, NP

## 2023-09-14 NOTE — Patient Instructions (Addendum)
Albuterol inhaler 1-2 puffs every 6hrs as needed  Astepro nasal spray As needed    Continue on CPAP At bedtime   Work on healthy weight  Do not drive if sleepy   Follow up in 1 year and As needed

## 2023-09-17 ENCOUNTER — Other Ambulatory Visit (HOSPITAL_COMMUNITY): Payer: Self-pay

## 2023-09-22 DIAGNOSIS — H524 Presbyopia: Secondary | ICD-10-CM | POA: Diagnosis not present

## 2023-09-28 ENCOUNTER — Encounter: Payer: Self-pay | Admitting: Family Medicine

## 2023-09-28 ENCOUNTER — Other Ambulatory Visit (HOSPITAL_COMMUNITY): Payer: Self-pay

## 2023-09-28 MED ORDER — TIRZEPATIDE 12.5 MG/0.5ML ~~LOC~~ SOAJ
12.5000 mg | SUBCUTANEOUS | 5 refills | Status: DC
  Filled 2023-09-28 – 2023-10-04 (×3): qty 6, 84d supply, fill #0
  Filled 2023-10-11 – 2023-10-15 (×2): qty 2, 28d supply, fill #0
  Filled 2023-11-08: qty 2, 28d supply, fill #1
  Filled 2023-12-06: qty 2, 28d supply, fill #2
  Filled 2024-01-03: qty 2, 28d supply, fill #3
  Filled 2024-01-30: qty 2, 28d supply, fill #4
  Filled 2024-02-27: qty 2, 28d supply, fill #5
  Filled 2024-03-27: qty 2, 28d supply, fill #6
  Filled 2024-04-25: qty 2, 28d supply, fill #7
  Filled 2024-04-29 – 2024-05-22 (×2): qty 2, 28d supply, fill #0
  Filled 2024-06-20: qty 2, 28d supply, fill #1

## 2023-09-28 NOTE — Telephone Encounter (Signed)
 I increased the dose to 12.5 mg and I sent this in

## 2023-10-01 ENCOUNTER — Other Ambulatory Visit (HOSPITAL_COMMUNITY): Payer: Self-pay

## 2023-10-01 ENCOUNTER — Other Ambulatory Visit: Payer: Self-pay

## 2023-10-01 MED ORDER — METFORMIN HCL ER 500 MG PO TB24
2000.0000 mg | ORAL_TABLET | Freq: Every day | ORAL | 0 refills | Status: DC
Start: 2023-10-01 — End: 2024-07-10
  Filled 2023-10-01: qty 360, 90d supply, fill #0

## 2023-10-04 ENCOUNTER — Other Ambulatory Visit (HOSPITAL_COMMUNITY): Payer: Self-pay

## 2023-10-05 ENCOUNTER — Other Ambulatory Visit (HOSPITAL_COMMUNITY): Payer: Self-pay

## 2023-10-05 ENCOUNTER — Other Ambulatory Visit (INDEPENDENT_AMBULATORY_CARE_PROVIDER_SITE_OTHER): Payer: Commercial Managed Care - PPO

## 2023-10-05 DIAGNOSIS — E890 Postprocedural hypothyroidism: Secondary | ICD-10-CM | POA: Diagnosis not present

## 2023-10-05 LAB — T4, FREE: Free T4: 0.85 ng/dL (ref 0.60–1.60)

## 2023-10-05 LAB — BASIC METABOLIC PANEL
BUN: 22 mg/dL (ref 6–23)
CO2: 29 meq/L (ref 19–32)
Calcium: 8.9 mg/dL (ref 8.4–10.5)
Chloride: 100 meq/L (ref 96–112)
Creatinine, Ser: 1.53 mg/dL — ABNORMAL HIGH (ref 0.40–1.50)
GFR: 49.26 mL/min — ABNORMAL LOW (ref >60.00)
Glucose, Bld: 93 mg/dL (ref 70–99)
Potassium: 3.7 meq/L (ref 3.5–5.1)
Sodium: 137 meq/L (ref 135–145)

## 2023-10-05 LAB — T3, FREE: T3, Free: 3.1 pg/mL (ref 2.3–4.2)

## 2023-10-05 LAB — TSH: TSH: 0.56 u[IU]/mL (ref 0.35–5.50)

## 2023-10-11 ENCOUNTER — Other Ambulatory Visit: Payer: Self-pay

## 2023-10-11 ENCOUNTER — Other Ambulatory Visit (HOSPITAL_COMMUNITY): Payer: Self-pay

## 2023-10-11 DIAGNOSIS — E119 Type 2 diabetes mellitus without complications: Secondary | ICD-10-CM | POA: Diagnosis not present

## 2023-10-11 DIAGNOSIS — H2513 Age-related nuclear cataract, bilateral: Secondary | ICD-10-CM | POA: Diagnosis not present

## 2023-10-11 LAB — HM DIABETES EYE EXAM

## 2023-10-15 ENCOUNTER — Other Ambulatory Visit (HOSPITAL_COMMUNITY): Payer: Self-pay

## 2023-10-16 ENCOUNTER — Other Ambulatory Visit (HOSPITAL_COMMUNITY): Payer: Self-pay

## 2023-11-05 ENCOUNTER — Other Ambulatory Visit: Payer: Self-pay | Admitting: Family Medicine

## 2023-11-05 ENCOUNTER — Other Ambulatory Visit (HOSPITAL_COMMUNITY): Payer: Self-pay

## 2023-11-07 ENCOUNTER — Encounter: Payer: Self-pay | Admitting: Family Medicine

## 2023-11-07 ENCOUNTER — Other Ambulatory Visit (HOSPITAL_COMMUNITY): Payer: Self-pay

## 2023-11-07 MED ORDER — HYDROCHLOROTHIAZIDE 25 MG PO TABS
25.0000 mg | ORAL_TABLET | Freq: Every day | ORAL | 1 refills | Status: DC
  Filled 2023-11-07: qty 90, 90d supply, fill #0
  Filled 2024-01-30: qty 90, 90d supply, fill #1

## 2023-12-05 DIAGNOSIS — G4733 Obstructive sleep apnea (adult) (pediatric): Secondary | ICD-10-CM | POA: Diagnosis not present

## 2023-12-07 ENCOUNTER — Other Ambulatory Visit (HOSPITAL_COMMUNITY): Payer: Self-pay

## 2023-12-10 ENCOUNTER — Encounter: Payer: Self-pay | Admitting: Family Medicine

## 2023-12-24 ENCOUNTER — Other Ambulatory Visit (HOSPITAL_COMMUNITY): Payer: Self-pay

## 2024-01-15 ENCOUNTER — Other Ambulatory Visit (HOSPITAL_COMMUNITY): Payer: Self-pay

## 2024-01-15 ENCOUNTER — Telehealth: Payer: Self-pay

## 2024-01-15 NOTE — Telephone Encounter (Signed)
 Pharmacy Patient Advocate Encounter   Received notification from CoverMyMeds that prior authorization for Mounjaro  2.5 is required/requested.   Insurance verification completed.   The patient is insured through [facility] .   Per test claim: Refill too soon. PA is not needed at this time. Medication was filled 01/03/24. Next eligible fill date is 01/26/24.

## 2024-01-16 NOTE — Telephone Encounter (Signed)
 Pt.notified

## 2024-03-25 DIAGNOSIS — G4733 Obstructive sleep apnea (adult) (pediatric): Secondary | ICD-10-CM | POA: Diagnosis not present

## 2024-04-29 ENCOUNTER — Other Ambulatory Visit (HOSPITAL_BASED_OUTPATIENT_CLINIC_OR_DEPARTMENT_OTHER): Payer: Self-pay

## 2024-05-12 ENCOUNTER — Other Ambulatory Visit: Payer: Self-pay | Admitting: Family Medicine

## 2024-05-13 ENCOUNTER — Other Ambulatory Visit (HOSPITAL_COMMUNITY): Payer: Self-pay

## 2024-05-13 MED ORDER — HYDROCHLOROTHIAZIDE 25 MG PO TABS
25.0000 mg | ORAL_TABLET | Freq: Every day | ORAL | 1 refills | Status: DC
  Filled 2024-05-13: qty 90, 90d supply, fill #0
  Filled 2024-08-14: qty 90, 90d supply, fill #1

## 2024-06-25 ENCOUNTER — Other Ambulatory Visit (HOSPITAL_BASED_OUTPATIENT_CLINIC_OR_DEPARTMENT_OTHER): Payer: Self-pay

## 2024-07-02 ENCOUNTER — Encounter (HOSPITAL_COMMUNITY): Payer: Self-pay | Admitting: Emergency Medicine

## 2024-07-02 ENCOUNTER — Other Ambulatory Visit (HOSPITAL_BASED_OUTPATIENT_CLINIC_OR_DEPARTMENT_OTHER): Payer: Self-pay

## 2024-07-02 ENCOUNTER — Ambulatory Visit (HOSPITAL_COMMUNITY)
Admission: EM | Admit: 2024-07-02 | Discharge: 2024-07-02 | Disposition: A | Discharge status: Home / Self Care | Attending: Internal Medicine | Admitting: Internal Medicine

## 2024-07-02 DIAGNOSIS — M25572 Pain in left ankle and joints of left foot: Secondary | ICD-10-CM

## 2024-07-02 HISTORY — DX: Unspecified osteoarthritis, unspecified site: M19.90

## 2024-07-02 MED ORDER — METHYLPREDNISOLONE 4 MG PO TBPK
ORAL_TABLET | ORAL | 0 refills | Status: DC
  Filled 2024-07-02: qty 21, 6d supply, fill #0

## 2024-07-02 NOTE — ED Provider Notes (Signed)
 [facility]    CSN: [id number] Arrival date & time: 07/02/24  9183      History   Chief Complaint Chief Complaint  Patient presents with   Ankle Pain    HPI Christopher Howard is a 58 y.o. male who presents urgent care for evaluation of left medial ankle pain.  Pain has been present x 3 days.  He is unaware of any inciting event or specific trauma preceding the onset of pain.  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  He has tried applying ice and taking ibuprofen, which has provided on sustained pain relief.  He states that his symptoms typically last 24-36 hours however his current pain has persisted.  He was on his feet for 8 hours the day before symptom onset, which he believes may have triggered his discomfort.  He describes his discomfort as a throbbing sensation.  Denies numbness or weakness in the left foot.  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 Aneita, benign polyps, repeat in 10 yrs    laser surgery mouth  07/2013 & 08/2013   THYROIDECTOMY     WISDOM TOOTH EXTRACTION       Home Medications    Prior to Admission medications   Medication Sig Start Date End Date  Taking? Authorizing Provider  methylPREDNISolone (MEDROL DOSEPAK) 4 MG TBPK tablet Use as directed. 07/02/24  Yes Melvenia Manus FORBES, MD  aspirin  EC 81 MG tablet Take 1 tablet (81 mg total) by mouth daily. Patient taking differently: Take 81 mg by mouth daily as needed. 11/01/16   Johnny Garnette LABOR, MD  Azelastine  HCl (ASTEPRO ) 0.15 % SOLN Place 1 spray into the nose 2 (two) times daily. 12/19/21   Sood, Vineet, MD  fluticasone  (FLONASE ) 50 MCG/ACT nasal spray Place 1 spray into both nostrils daily. 10/11/21   Sood, Vineet, MD  hydrochlorothiazide  (HYDRODIURIL ) 25 MG tablet Take 1 tablet (25 mg total) by mouth daily. 05/13/24   Johnny Garnette LABOR, MD  levothyroxine  (SYNTHROID ) 100 MCG tablet Take 1 tablet (100 mcg total) by mouth every morning on an empty stomach. 08/21/23   Johnny Garnette LABOR, MD  tirzepatide  (MOUNJARO ) 12.5 MG/0.5ML Pen Inject 12.5 mg into the skin once a week. 09/28/23   Johnny Garnette LABOR, MD  metFORMIN  (GLUCOPHAGE -XR) 500 MG 24 hr tablet Take 4 tablets (2,000 mg total) by mouth daily with meals. 10/01/23 10/05/23      Family History Family History  Problem Relation Age of Onset   Throat cancer Father    Heart attack Father    Arthritis Other    Cancer Other  colon   Diabetes Other    Kidney disease Other    Stroke Other    Heart disease Other    Stroke Mother    Diabetes Maternal Uncle    Colon cancer Neg Hx    Esophageal cancer Neg Hx    Rectal cancer Neg Hx    Stomach cancer Neg Hx    Thyroid  disease Neg Hx     Social History Social History   Tobacco Use   Smoking status: Former    Current packs/day: 0.00    Average packs/day: 0.3 packs/day for 20.0 years (5.0 ttl pk-yrs)    Types: Cigarettes    Start date: 08/22/1991    Quit date: 08/22/2011    Years since quitting: 12.8   Smokeless tobacco: Never  Vaping Use   Vaping status: Never Used  Substance Use Topics   Alcohol use: No    Alcohol/week: 0.0 standard drinks of alcohol   Drug use: No     Allergies    Ketoconazole    Review of Systems Review of Systems  Musculoskeletal:        Left medial ankle pain  All other systems reviewed and are negative.  Physical Exam Triage Vital Signs ED Triage Vitals  Encounter Vitals Group     BP 07/02/24 0829 128/87     Girls Systolic BP Percentile --      Girls Diastolic BP Percentile --      Boys Systolic BP Percentile --      Boys Diastolic BP Percentile --      Pulse Rate 07/02/24 0829 70     Resp 07/02/24 0829 17     Temp 07/02/24 0829 97.6 F (36.4 C)     Temp Source 07/02/24 0829 Oral     SpO2 07/02/24 0829 97 %     Weight --      Height --      Head Circumference --      Peak Flow --      Pain Score 07/02/24 0827 8     Pain Loc --      Pain Education --      Exclude from Growth Chart --    No data found.  Updated Vital Signs BP 128/87 (BP Location: Right Arm)   Pulse 70   Temp 97.6 F (36.4 C) (Oral)   Resp 17   SpO2 97%   Visual Acuity Right Eye Distance:   Left Eye Distance:   Bilateral Distance:    Right Eye Near:   Left Eye Near:    Bilateral Near:     Physical Exam Vitals reviewed.  Constitutional:      General: He is not in acute distress.    Appearance: Normal appearance. He is not toxic-appearing.  Musculoskeletal:        General: Swelling and tenderness present.     Comments: There is mild swelling present along the medial aspect of the left ankle.  TTP posterior to the medial malleolus, proximal to the calcaneus.  ROM of the left ankle is limited secondary to pain.  No bony tenderness.  Grossly NV intact.  Neurological:     Mental Status: He is alert.     Gait: Gait abnormal (Antalgic gait, favoring right leg, ambulating with a walking stick).    UC Treatments / Results  Labs (all labs ordered are listed, but only abnormal results are displayed) Labs Reviewed - No data to display  EKG   Radiology No results found.  Procedures Procedures (including critical care time)  Medications Ordered  in UC Medications - No data to display  Initial Impression / Assessment and Plan / UC Course  I have reviewed the triage vital signs and the nursing notes.  Pertinent labs & imaging results that were available during my care of the patient were reviewed by me and considered in my medical decision making (see chart for details).    Patient is 58 year old male presenting urgent care endorsing left medial ankle pain x 3 days.  He had been on his feet more than usual the day preceding the onset of discomfort.  Known history of left knee OA and patient states he has been told he has ankle arthritis as well.  On exam there is tenderness to palpation posterior to the medial malleolus and proximal to the calcaneus.  ROM of the ankle is limited secondary to pain.  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  Treatment options reviewed.  I recommended that he continue icing and as needed use of ibuprofen.  I will add a Medrol Dosepak as well.  He can continue activity using pain as a guide.  He was instructed to return to care if pain worsens or fails to improve.  Patient is in agreement with this plan and is medically stable for discharge at this time.  Final Clinical Impressions(s) / UC Diagnoses   Final diagnoses:  Acute left ankle pain     Discharge Instructions      I believe the pain in your ankle is due to a flare of arthritis. Continue as needed use of ibuprofen and icing. I have also prescribed a steroid pack to help with pain relief. Consider adding an insert to your shoe to help prevent recurrence of pain. Follow up if pain worsens or does not improve.     ED Prescriptions     Medication Sig Dispense Auth. Provider   methylPREDNISolone (MEDROL DOSEPAK) 4 MG TBPK tablet Use as directed. 21 each Melvenia Manus BRAVO, MD      PDMP not reviewed this encounter.   Melvenia Manus BRAVO, MD 07/02/24 [phone]

## 2024-07-02 NOTE — ED Triage Notes (Signed)
 Pt reports woke up with left ankle pain on Sunday. Denies any injury. Reports hx arthritis that normally goes away before now. Tried ice, elevation, soaked in hot water made it worse. Takes ibuprofen ibuprofen 600 mg that helped short term

## 2024-07-02 NOTE — Discharge Instructions (Signed)
 I believe the pain in your ankle is due to a flare of arthritis. Continue as needed use of ibuprofen and icing. I have also prescribed a steroid pack to help with pain relief. Consider adding an insert to your shoe to help prevent recurrence of pain. Follow up if pain worsens or does not improve.

## 2024-07-05 DIAGNOSIS — G4733 Obstructive sleep apnea (adult) (pediatric): Secondary | ICD-10-CM | POA: Diagnosis not present

## 2024-07-09 ENCOUNTER — Encounter: Payer: Self-pay | Admitting: Family Medicine

## 2024-07-10 ENCOUNTER — Encounter: Payer: Self-pay | Admitting: Family Medicine

## 2024-07-10 ENCOUNTER — Ambulatory Visit: Admitting: Family Medicine

## 2024-07-10 ENCOUNTER — Other Ambulatory Visit (HOSPITAL_BASED_OUTPATIENT_CLINIC_OR_DEPARTMENT_OTHER): Payer: Self-pay

## 2024-07-10 ENCOUNTER — Ambulatory Visit (INDEPENDENT_AMBULATORY_CARE_PROVIDER_SITE_OTHER)

## 2024-07-10 ENCOUNTER — Ambulatory Visit: Payer: Self-pay | Admitting: Family Medicine

## 2024-07-10 VITALS — BP 110/60 | HR 63 | Temp 98.1°F | Wt 241.2 lb

## 2024-07-10 DIAGNOSIS — N1832 Chronic kidney disease, stage 3b: Secondary | ICD-10-CM

## 2024-07-10 DIAGNOSIS — E119 Type 2 diabetes mellitus without complications: Secondary | ICD-10-CM

## 2024-07-10 DIAGNOSIS — E890 Postprocedural hypothyroidism: Secondary | ICD-10-CM | POA: Diagnosis not present

## 2024-07-10 DIAGNOSIS — M25572 Pain in left ankle and joints of left foot: Secondary | ICD-10-CM

## 2024-07-10 DIAGNOSIS — Z7985 Long-term (current) use of injectable non-insulin antidiabetic drugs: Secondary | ICD-10-CM | POA: Diagnosis not present

## 2024-07-10 DIAGNOSIS — N138 Other obstructive and reflux uropathy: Secondary | ICD-10-CM

## 2024-07-10 DIAGNOSIS — M779 Enthesopathy, unspecified: Secondary | ICD-10-CM | POA: Diagnosis not present

## 2024-07-10 DIAGNOSIS — M7989 Other specified soft tissue disorders: Secondary | ICD-10-CM | POA: Diagnosis not present

## 2024-07-10 DIAGNOSIS — M109 Gout, unspecified: Secondary | ICD-10-CM | POA: Diagnosis not present

## 2024-07-10 DIAGNOSIS — N401 Enlarged prostate with lower urinary tract symptoms: Secondary | ICD-10-CM

## 2024-07-10 DIAGNOSIS — M19072 Primary osteoarthritis, left ankle and foot: Secondary | ICD-10-CM | POA: Diagnosis not present

## 2024-07-10 LAB — CBC WITH DIFFERENTIAL/PLATELET
Basophils Absolute: 0.1 K/uL (ref 0.0–0.1)
Basophils Relative: 1.3 % (ref 0.0–3.0)
Eosinophils Absolute: 0.4 K/uL (ref 0.0–0.7)
Eosinophils Relative: 7.5 % — ABNORMAL HIGH (ref 0.0–5.0)
HCT: 40.3 % (ref 39.0–52.0)
Hemoglobin: 13.4 g/dL (ref 13.0–17.0)
Lymphocytes Relative: 23.7 % (ref 12.0–46.0)
Lymphs Abs: 1.2 K/uL (ref 0.7–4.0)
MCHC: 33.3 g/dL (ref 30.0–36.0)
MCV: 84.7 fl (ref 78.0–100.0)
Monocytes Absolute: 0.4 K/uL (ref 0.1–1.0)
Monocytes Relative: 8.7 % (ref 3.0–12.0)
Neutro Abs: 3 K/uL (ref 1.4–7.7)
Neutrophils Relative %: 58.8 % (ref 43.0–77.0)
Platelets: 219 K/uL (ref 150.0–400.0)
RBC: 4.76 Mil/uL (ref 4.22–5.81)
RDW: 14.8 % (ref 11.5–15.5)
WBC: 5.1 K/uL (ref 4.0–10.5)

## 2024-07-10 LAB — BASIC METABOLIC PANEL WITH GFR
BUN: 28 mg/dL — ABNORMAL HIGH (ref 6–23)
CO2: 31 meq/L (ref 19–32)
Calcium: 8.9 mg/dL (ref 8.4–10.5)
Chloride: 101 meq/L (ref 96–112)
Creatinine, Ser: 1.65 mg/dL — ABNORMAL HIGH (ref 0.40–1.50)
GFR: 44.76 mL/min — ABNORMAL LOW (ref >60.00)
Glucose, Bld: 77 mg/dL (ref 70–99)
Potassium: 4.1 meq/L (ref 3.5–5.1)
Sodium: 139 meq/L (ref 135–145)

## 2024-07-10 LAB — LIPID PANEL
Cholesterol: 178 mg/dL (ref 0–200)
HDL: 54.6 mg/dL (ref >39.00)
LDL Cholesterol: 102 mg/dL — ABNORMAL HIGH (ref 0–99)
NonHDL: 123.85
Total CHOL/HDL Ratio: 3
Triglycerides: 107 mg/dL (ref 0.0–149.0)
VLDL: 21.4 mg/dL (ref 0.0–40.0)

## 2024-07-10 LAB — HEPATIC FUNCTION PANEL
ALT: 10 U/L (ref 0–53)
AST: 12 U/L (ref 0–37)
Albumin: 4 g/dL (ref 3.5–5.2)
Alkaline Phosphatase: 71 U/L (ref 39–117)
Bilirubin, Direct: 0.1 mg/dL (ref 0.0–0.3)
Total Bilirubin: 0.4 mg/dL (ref 0.2–1.2)
Total Protein: 7.1 g/dL (ref 6.0–8.3)

## 2024-07-10 LAB — TSH: TSH: 1.64 u[IU]/mL (ref 0.35–5.50)

## 2024-07-10 LAB — PSA: PSA: 2.78 ng/mL (ref 0.10–4.00)

## 2024-07-10 LAB — T3, FREE: T3, Free: 2.6 pg/mL (ref 2.3–4.2)

## 2024-07-10 LAB — URIC ACID: Uric Acid, Serum: 8.8 mg/dL — ABNORMAL HIGH (ref 4.0–7.8)

## 2024-07-10 LAB — HEMOGLOBIN A1C: Hgb A1c MFr Bld: 5.9 % (ref 4.6–6.5)

## 2024-07-10 LAB — T4, FREE: Free T4: 0.83 ng/dL (ref 0.60–1.60)

## 2024-07-10 MED ORDER — COLCHICINE 0.6 MG PO TABS
0.6000 mg | ORAL_TABLET | Freq: Four times a day (QID) | ORAL | 5 refills | Status: AC | PRN
  Filled 2024-07-10: qty 60, 15d supply, fill #0
  Filled 2024-08-31: qty 60, 15d supply, fill #1

## 2024-07-10 MED ORDER — TIRZEPATIDE 15 MG/0.5ML ~~LOC~~ SOAJ
15.0000 mg | SUBCUTANEOUS | 5 refills | Status: AC
  Filled 2024-07-10: qty 6, 84d supply, fill #0
  Filled 2024-07-18: qty 2, 28d supply, fill #0
  Filled 2024-08-14: qty 2, 28d supply, fill #1
  Filled 2024-09-11: qty 2, 28d supply, fill #2

## 2024-07-10 NOTE — Progress Notes (Signed)
   Subjective:    Patient ID: Christopher Howard, male    DOB: 1966/04/02, 58 y.o.   MRN: [id number]  HPI Here to follow up on an urgent care visit on 07-02-24 for the sudden onset of swelling and pain in the left ankle. This started 3 days before the UC visit. No recent trauma. This had never happened before, but he does relate that he has had several episodes in the last few years of sudden swelling and pain in the left knee. At the UC he was given a Medroil dose pack, and this helped a lot for the first few days. Then as the dose of Methylprednisone decreased the pain got worse again. In addition he asks to increase his dose of Moujaro.    Review of Systems  Constitutional: Negative.   Respiratory: Negative.    Cardiovascular: Negative.   Musculoskeletal:  Positive for arthralgias and joint swelling.       Objective:   Physical Exam Constitutional:      Comments: In pain, limping   Cardiovascular:     Rate and Rhythm: Normal rate and regular rhythm.     Pulses: Normal pulses.     Heart sounds: Normal heart sounds.  Pulmonary:     Effort: Pulmonary effort is normal.     Breath sounds: Normal breath sounds.  Musculoskeletal:     Comments: The medial left ankle is warm, swollen, and very tender. No erythema. ROM of the ankle is limited by pain  Neurological:     Mental Status: He is alert.           Assessment & Plan:  This is consistent with gout in the ankle. We will start him on Colchicine 0.6 mg every 6 hours as needed. Get labs today including a uric acid level. We will also get Xrays of the ankle today. In addition, we will increase the Mounjaro  to 15 mg weekly. I personally spent a total of 34 minutes in the care of the patient today including getting/reviewing separately obtained history, performing a medically appropriate exam/evaluation, counseling and educating, and placing orders.  Garnette Olmsted, MD

## 2024-07-11 ENCOUNTER — Other Ambulatory Visit (HOSPITAL_BASED_OUTPATIENT_CLINIC_OR_DEPARTMENT_OTHER): Payer: Self-pay

## 2024-07-11 DIAGNOSIS — N183 Chronic kidney disease, stage 3 unspecified: Secondary | ICD-10-CM | POA: Insufficient documentation

## 2024-07-11 MED ORDER — ALLOPURINOL 100 MG PO TABS
100.0000 mg | ORAL_TABLET | Freq: Every day | ORAL | 3 refills | Status: AC
Start: 2024-07-11 — End: ?
  Filled 2024-07-11: qty 90, 90d supply, fill #0

## 2024-07-11 NOTE — Addendum Note (Signed)
 Addended by: JOHNNY SENIOR A on: 07/11/2024 12:57 PM   Modules accepted: Orders

## 2024-07-11 NOTE — Telephone Encounter (Signed)
 Just take the Colchicine  and keep the foot elevated. If not better by Monday, let me know

## 2024-07-18 ENCOUNTER — Other Ambulatory Visit: Payer: Self-pay

## 2024-07-18 ENCOUNTER — Other Ambulatory Visit (HOSPITAL_BASED_OUTPATIENT_CLINIC_OR_DEPARTMENT_OTHER): Payer: Self-pay

## 2024-08-22 ENCOUNTER — Ambulatory Visit: Admitting: Family Medicine

## 2024-08-22 ENCOUNTER — Other Ambulatory Visit (HOSPITAL_BASED_OUTPATIENT_CLINIC_OR_DEPARTMENT_OTHER): Payer: Self-pay

## 2024-08-22 ENCOUNTER — Ambulatory Visit: Payer: Self-pay | Admitting: Family Medicine

## 2024-08-22 ENCOUNTER — Encounter: Payer: Self-pay | Admitting: Family Medicine

## 2024-08-22 VITALS — BP 110/76 | HR 58 | Temp 97.9°F | Ht 69.0 in | Wt 240.0 lb

## 2024-08-22 DIAGNOSIS — E119 Type 2 diabetes mellitus without complications: Secondary | ICD-10-CM | POA: Diagnosis not present

## 2024-08-22 DIAGNOSIS — Z7985 Long-term (current) use of injectable non-insulin antidiabetic drugs: Secondary | ICD-10-CM

## 2024-08-22 DIAGNOSIS — Z0000 Encounter for general adult medical examination without abnormal findings: Secondary | ICD-10-CM | POA: Diagnosis not present

## 2024-08-22 LAB — MICROALBUMIN / CREATININE URINE RATIO
Creatinine,U: 231.5 mg/dL
Microalb Creat Ratio: 11.5 mg/g (ref 0.0–30.0)
Microalb, Ur: 2.7 mg/dL — ABNORMAL HIGH (ref 0.7–1.9)

## 2024-08-22 MED ORDER — HYDROCHLOROTHIAZIDE 25 MG PO TABS
25.0000 mg | ORAL_TABLET | Freq: Every day | ORAL | 3 refills | Status: AC
  Filled 2024-08-22: qty 90, 90d supply, fill #0

## 2024-08-22 MED ORDER — LEVOTHYROXINE SODIUM 100 MCG PO TABS
100.0000 ug | ORAL_TABLET | ORAL | 3 refills | Status: AC
  Filled 2024-08-22: qty 90, 90d supply, fill #0

## 2024-08-22 NOTE — Progress Notes (Signed)
 "  Subjective:    Patient ID: Christopher Howard, male    DOB: [birth year], 59 y.o.   MRN: [id number]  HPI Here for a well exam. He feels well in general. He has not had a gout attack since he started taking Allopurinol  in November. He had labs drawn here on 07-10-24, and these were significant for an A1c of 5.9%, a creatinine of 1.65, and a GFR of 44.76. We have referred him to Nephrology for CKD, but he has not heard from them yet. He gets eye exams every 6 months.    Review of Systems  Constitutional: Negative.   HENT: Negative.    Eyes: Negative.   Respiratory: Negative.    Cardiovascular: Negative.   Gastrointestinal: Negative.   Genitourinary: Negative.   Musculoskeletal: Negative.   Skin: Negative.   Neurological: Negative.   Psychiatric/Behavioral: Negative.         Objective:   Physical Exam Constitutional:      General: He is not in acute distress.    Appearance: He is well-developed. He is obese. He is not diaphoretic.  HENT:     Head: Normocephalic and atraumatic.     Right Ear: External ear normal.     Left Ear: External ear normal.     Nose: Nose normal.     Mouth/Throat:     Pharynx: No oropharyngeal exudate.  Eyes:     General: No scleral icterus.       Right eye: No discharge.        Left eye: No discharge.     Conjunctiva/sclera: Conjunctivae normal.     Pupils: Pupils are equal, round, and reactive to light.  Neck:     Thyroid : No thyromegaly.     Vascular: No JVD.     Trachea: No tracheal deviation.  Cardiovascular:     Rate and Rhythm: Normal rate and regular rhythm.     Pulses: Normal pulses.     Heart sounds: Normal heart sounds. No murmur heard.    No friction rub. No gallop.  Pulmonary:     Effort: Pulmonary effort is normal. No respiratory distress.     Breath sounds: Normal breath sounds. No wheezing or rales.  Chest:     Chest wall: No tenderness.  Abdominal:     General: Bowel sounds are normal. There is no distension.     Palpations:  Abdomen is soft. There is no mass.     Tenderness: There is no abdominal tenderness. There is no guarding or rebound.  Genitourinary:    Penis: Normal. No tenderness.      Testes: Normal.     Prostate: Normal.     Rectum: Normal. Guaiac result negative.  Musculoskeletal:        General: No tenderness. Normal range of motion.     Cervical back: Neck supple.  Lymphadenopathy:     Cervical: No cervical adenopathy.  Skin:    General: Skin is warm and dry.     Coloration: Skin is not pale.     Findings: No erythema or rash.     Comments: Both feet are warm with full pulses. The skin is clear. Sensation to light touch is intact   Neurological:     General: No focal deficit present.     Mental Status: He is alert and oriented to person, place, and time.     Cranial Nerves: No cranial nerve deficit.     Motor: No abnormal muscle tone.  Coordination: Coordination normal.     Deep Tendon Reflexes: Reflexes are normal and symmetric. Reflexes normal.  Psychiatric:        Mood and Affect: Mood normal.        Behavior: Behavior normal.        Thought Content: Thought content normal.        Judgment: Judgment normal.           Assessment & Plan:  Well exam. We discussed diet and exercise. We will set him up for another colonoscopy. He will contact the Nephrology office to set up an appt.  Garnette Olmsted, MD    "

## 2024-08-26 ENCOUNTER — Other Ambulatory Visit (HOSPITAL_COMMUNITY): Payer: Self-pay
